# Patient Record
Sex: Male | Born: 1950 | Race: White | Hispanic: No | Marital: Married | Smoking: Former smoker
Health system: Southern US, Community
[De-identification: ages and names within clinical notes are randomized; demographics above are authoritative.]

## PROBLEM LIST (undated history)

## (undated) DIAGNOSIS — E781 Pure hyperglyceridemia: Secondary | ICD-10-CM

## (undated) DIAGNOSIS — C61 Malignant neoplasm of prostate: Secondary | ICD-10-CM

## (undated) DIAGNOSIS — I209 Angina pectoris, unspecified: Secondary | ICD-10-CM

## (undated) DIAGNOSIS — I10 Essential (primary) hypertension: Secondary | ICD-10-CM

## (undated) DIAGNOSIS — R51 Headache: Secondary | ICD-10-CM

## (undated) DIAGNOSIS — E119 Type 2 diabetes mellitus without complications: Secondary | ICD-10-CM

## (undated) DIAGNOSIS — M1990 Unspecified osteoarthritis, unspecified site: Secondary | ICD-10-CM

## (undated) HISTORY — PX: PROSTATE SURGERY: SHX751

## (undated) HISTORY — PX: COLONOSCOPY W/ POLYPECTOMY: SHX1380

## (undated) HISTORY — PX: BACK SURGERY: SHX140

---

## 1976-08-25 HISTORY — PX: APPENDECTOMY: SHX54

## 1989-04-25 HISTORY — PX: CHOLECYSTECTOMY: SHX55

## 1989-04-25 HISTORY — PX: LUMBAR DISC SURGERY: SHX700

## 2001-03-29 ENCOUNTER — Encounter: Payer: Self-pay | Admitting: Neurosurgery

## 2001-03-29 ENCOUNTER — Ambulatory Visit (HOSPITAL_COMMUNITY): Admission: RE | Admit: 2001-03-29 | Discharge: 2001-03-29 | Payer: Self-pay | Admitting: Neurosurgery

## 2006-08-07 ENCOUNTER — Ambulatory Visit: Admission: RE | Admit: 2006-08-07 | Discharge: 2006-11-05 | Payer: Self-pay | Admitting: *Deleted

## 2013-05-18 ENCOUNTER — Emergency Department (HOSPITAL_COMMUNITY): Payer: BC Managed Care – PPO

## 2013-05-18 ENCOUNTER — Encounter (HOSPITAL_COMMUNITY): Payer: Self-pay | Admitting: Family Medicine

## 2013-05-18 ENCOUNTER — Observation Stay (HOSPITAL_COMMUNITY)
Admission: EM | Admit: 2013-05-18 | Discharge: 2013-05-19 | Disposition: A | Discharge status: Home / Self Care | Payer: BC Managed Care – PPO | Attending: Cardiology | Admitting: Cardiology

## 2013-05-18 DIAGNOSIS — I208 Other forms of angina pectoris: Secondary | ICD-10-CM | POA: Diagnosis present

## 2013-05-18 DIAGNOSIS — I10 Essential (primary) hypertension: Secondary | ICD-10-CM | POA: Insufficient documentation

## 2013-05-18 DIAGNOSIS — I209 Angina pectoris, unspecified: Principal | ICD-10-CM | POA: Insufficient documentation

## 2013-05-18 DIAGNOSIS — R6884 Jaw pain: Secondary | ICD-10-CM | POA: Insufficient documentation

## 2013-05-18 DIAGNOSIS — Z79899 Other long term (current) drug therapy: Secondary | ICD-10-CM | POA: Insufficient documentation

## 2013-05-18 DIAGNOSIS — E785 Hyperlipidemia, unspecified: Secondary | ICD-10-CM | POA: Insufficient documentation

## 2013-05-18 DIAGNOSIS — Z7982 Long term (current) use of aspirin: Secondary | ICD-10-CM | POA: Insufficient documentation

## 2013-05-18 DIAGNOSIS — I200 Unstable angina: Secondary | ICD-10-CM

## 2013-05-18 DIAGNOSIS — E119 Type 2 diabetes mellitus without complications: Secondary | ICD-10-CM | POA: Insufficient documentation

## 2013-05-18 DIAGNOSIS — Z87891 Personal history of nicotine dependence: Secondary | ICD-10-CM | POA: Insufficient documentation

## 2013-05-18 DIAGNOSIS — I2089 Other forms of angina pectoris: Secondary | ICD-10-CM | POA: Diagnosis present

## 2013-05-18 DIAGNOSIS — E781 Pure hyperglyceridemia: Secondary | ICD-10-CM | POA: Insufficient documentation

## 2013-05-18 HISTORY — DX: Malignant neoplasm of prostate: C61

## 2013-05-18 HISTORY — DX: Pure hyperglyceridemia: E78.1

## 2013-05-18 HISTORY — DX: Type 2 diabetes mellitus without complications: E11.9

## 2013-05-18 HISTORY — DX: Angina pectoris, unspecified: I20.9

## 2013-05-18 HISTORY — DX: Essential (primary) hypertension: I10

## 2013-05-18 LAB — CBC
HCT: 42.4 % (ref 39.0–52.0)
MCV: 86.9 fL (ref 78.0–100.0)
Platelets: 228 10*3/uL (ref 150–400)
RBC: 4.88 MIL/uL (ref 4.22–5.81)
RDW: 13.4 % (ref 11.5–15.5)
WBC: 4.7 10*3/uL (ref 4.0–10.5)

## 2013-05-18 LAB — BASIC METABOLIC PANEL
CO2: 26 mEq/L (ref 19–32)
Calcium: 9.2 mg/dL (ref 8.4–10.5)
Chloride: 103 mEq/L (ref 96–112)
Creatinine, Ser: 0.83 mg/dL (ref 0.50–1.35)
GFR calc Af Amer: >90 mL/min (ref >90)
Potassium: 4.1 mEq/L (ref 3.5–5.1)
Sodium: 138 mEq/L (ref 135–145)

## 2013-05-18 LAB — POCT I-STAT TROPONIN I: Troponin i, poc: 0 ng/mL (ref 0.00–0.08)

## 2013-05-18 MED ORDER — ZOLPIDEM TARTRATE 5 MG PO TABS: 5.0000 mg | ORAL_TABLET | Freq: Every evening | ORAL | Status: DC | PRN

## 2013-05-18 MED ORDER — SIMVASTATIN 40 MG PO TABS
40.0000 mg | ORAL_TABLET | Freq: Every day | ORAL | Status: DC
  Administered 2013-05-19: 40 mg via ORAL
  Filled 2013-05-18: qty 1

## 2013-05-18 MED ORDER — ALPRAZOLAM 0.25 MG PO TABS: 0.2500 mg | ORAL_TABLET | Freq: Two times a day (BID) | ORAL | Status: DC | PRN

## 2013-05-18 MED ORDER — ONDANSETRON HCL 4 MG/2ML IJ SOLN: 4.0000 mg | Freq: Four times a day (QID) | INTRAMUSCULAR | Status: DC | PRN

## 2013-05-18 MED ORDER — ATENOLOL 25 MG PO TABS
25.0000 mg | ORAL_TABLET | Freq: Every day | ORAL | Status: DC
  Administered 2013-05-19: 25 mg via ORAL
  Filled 2013-05-18 (×2): qty 1

## 2013-05-18 MED ORDER — ENALAPRIL MALEATE 5 MG PO TABS
5.0000 mg | ORAL_TABLET | Freq: Every day | ORAL | Status: DC
  Administered 2013-05-19: 5 mg via ORAL
  Filled 2013-05-18: qty 1

## 2013-05-18 MED ORDER — ENOXAPARIN SODIUM 40 MG/0.4ML ~~LOC~~ SOLN
40.0000 mg | SUBCUTANEOUS | Status: DC
  Administered 2013-05-18: 40 mg via SUBCUTANEOUS
  Filled 2013-05-18 (×2): qty 0.4

## 2013-05-18 MED ORDER — OMEGA-3-ACID ETHYL ESTERS 1 G PO CAPS
1.0000 g | ORAL_CAPSULE | Freq: Two times a day (BID) | ORAL | Status: DC
Start: 2013-05-18 — End: 2013-05-19
  Administered 2013-05-18 – 2013-05-19 (×2): 1 g via ORAL
  Filled 2013-05-18 (×3): qty 1

## 2013-05-18 MED ORDER — ASPIRIN 81 MG PO CHEW
324.0000 mg | CHEWABLE_TABLET | Freq: Once | ORAL | Status: AC
  Administered 2013-05-18: 324 mg via ORAL
  Filled 2013-05-18: qty 4

## 2013-05-18 MED ORDER — ASPIRIN 81 MG PO CHEW
81.0000 mg | CHEWABLE_TABLET | Freq: Two times a day (BID) | ORAL | Status: DC
  Administered 2013-05-18 – 2013-05-19 (×2): 81 mg via ORAL
  Filled 2013-05-18 (×2): qty 1

## 2013-05-18 MED ORDER — ACETAMINOPHEN 325 MG PO TABS
650.0000 mg | ORAL_TABLET | ORAL | Status: DC | PRN
  Administered 2013-05-19: 650 mg via ORAL
  Filled 2013-05-18: qty 2

## 2013-05-18 MED ORDER — ASPIRIN 81 MG PO TABS: 81.0000 mg | ORAL_TABLET | Freq: Two times a day (BID) | ORAL | Status: DC

## 2013-05-18 NOTE — ED Notes (Signed)
Pt c/o rt jaw pain and chest pain that began after walking. Pt states the pain was sharp in nature. Pt states he had this same pain about a month ago and had a stress test done, test showed an enlarged left ventricle. Pt was prescribed nitro, pt state she did not get any immediate relief when he took 1 Nitro today. Pt also reports pain lasted for . Pt is pain free at the moment.

## 2013-05-18 NOTE — ED Provider Notes (Signed)
CSN: [id number]     Arrival date & time 05/18/13  1150 History   First MD Initiated Contact with Patient 05/18/13 1206     Chief Complaint  Patient presents with  . 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 He lives about 20 or 30 miles [address] [facility]. He was here taking a pressure from his church to a medical appointment. He was simply walking down the hallway. No unusual exertion. He felt pain in his right jaw like a bad toothache. The pain went to his neck and into his chest. He did not get pain in his extremities. He felt briefly lightheaded. He stopped and leaned against the wall. He states he became quite diaphoretic. He was placed in a wheelchair. He sat in a hallway for a few minutes. Within 10 minutes this resolved. He has not locked or exerted since. He had a similar episode with jaw pain chest pain arm pain and nausea 3 months ago. He underwent a nuclear medicine scan. His knowledge of that is that it was normal. This was done with to his primary care physician. He is hypertensive. He recently had a second medication added to his atenolol. He does not recall the name. He takes metformin for non-insulin-dependent, type 2 diabetes. His episode of pain today he would estimate of less than 10 minutes.  Past Medical History  Diagnosis Date  . Hypertension   . Diabetes mellitus without complication    Past Surgical History  Procedure Laterality Date  . Back surgery    . Cholecystectomy    . Appendectomy     History reviewed. No pertinent family history. History  Substance Use Topics  . Smoking status: Never Smoker   . Smokeless tobacco: Not on file  . Alcohol Use: No    Review of Systems  Constitutional: Negative for fever, chills, diaphoresis, appetite change and fatigue.  HENT: Negative for sore throat, mouth sores and trouble swallowing.        Right jaw pain.  Neck pain  Eyes: Negative for visual disturbance.  Respiratory: Positive for chest tightness. Negative for  cough, shortness of breath and wheezing.   Cardiovascular: Positive for chest pain. Negative for palpitations and leg swelling.  Gastrointestinal: Negative for nausea, vomiting, abdominal pain, diarrhea and abdominal distention.  Endocrine: Negative for polydipsia, polyphagia and polyuria.  Genitourinary: Negative for dysuria, frequency and hematuria.  Musculoskeletal: Negative for gait problem.  Skin: Negative for color change, pallor and rash.  Neurological: Positive for dizziness. Negative for syncope, light-headedness and headaches.  Hematological: Does not bruise/bleed easily.  Psychiatric/Behavioral: Negative for behavioral problems and confusion.    Allergies  Neosporin  Home Medications   Current Outpatient Rx  Name  Route  Sig  Dispense  Refill  . aspirin 81 MG tablet   Oral   Take 81 mg by mouth 2 (two) times daily.         Marland Kitchen atenolol (TENORMIN) 25 MG tablet   Oral   Take 25 mg by mouth daily.         Marland Kitchen CINNAMON PO   Oral   Take 2 tablets by mouth 2 (two) times daily.         . enalapril (VASOTEC) 5 MG tablet   Oral   Take 5 mg by mouth daily.         . metFORMIN (GLUCOPHAGE) 500 MG tablet   Oral   Take 500 mg by mouth 2 (two) times daily with  a meal.         . Omega-3 Fatty Acids (FISH OIL PO)   Oral   Take 1 tablet by mouth 2 (two) times daily.          BP 117/68  Pulse 55  Temp(Src) 97.9 F (36.6 C) (Oral)  Resp 14  Ht 6\' 3"  (1.905 m)  Wt 318 lb (144.244 kg)  BMI 39.75 kg/m2  SpO2 98% Physical Exam  Constitutional: He is oriented to person, place, and time. He appears well-developed and well-nourished. No distress.  Very pleasant obese 62 year old male. Currently not diaphoretic or pale. Conversant.  Calm  HENT:  Head: Normocephalic.  Eyes: Conjunctivae are normal. Pupils are equal, round, and reactive to light. No scleral icterus.  Neck: Normal range of motion. Neck supple. No thyromegaly present.  No bruits  Cardiovascular:  Normal rate, regular rhythm, S1 normal and S2 normal.  Exam reveals no gallop and no friction rub.   No murmur heard. Normal heart tones  Pulmonary/Chest: Effort normal and breath sounds normal. No respiratory distress. He has no wheezes. He has no rales.  Clear lungs  Abdominal: Soft. Bowel sounds are normal. He exhibits no distension. There is no tenderness. There is no rebound.  Musculoskeletal: Normal range of motion.  Neurological: He is alert and oriented to person, place, and time.  Skin: Skin is warm and dry. No rash noted.  No edema  Psychiatric: He has a normal mood and affect. His behavior is normal.    ED Course  Procedures (including critical care time)  EKG: Indication chest pain. Interpretation: Sinus bradycardia rate of 56. Slow R wave progression. No comparison. No Q waves, no ST segment changes or inverted T waves  Labs Review Labs Reviewed  BASIC METABOLIC PANEL - Abnormal; Notable for the following:    Glucose, Bld 113 (*)    All other components within normal limits  CBC  POCT I-STAT TROPONIN I   Imaging Review Dg Chest 2 View  05/18/2013   CLINICAL DATA:  Chest pain  EXAM: CHEST  2 VIEW  COMPARISON:  None.  FINDINGS: The heart size and mediastinal contours are within normal limits. No acute infiltrate or pulmonary edema. Mild degenerative changes mid and lower thoracic spine.  IMPRESSION: No active disease. Mild degenerative changes thoracic spine.   Electronically Signed   By: Natasha Mead   On: 05/18/2013 13:34    MDM   1. Unstable angina    Pain is concerning in this in its description for surgical angina. He's had no symptoms at rest. A brief, less than 10 minutes. EKG does not show injury or infarct.   Initial troponin is normal. He continues to be symptom-free. I discussed the case with cardiology on-call. Their plan is to see the patient emergency room. Second troponin, to a level, pending at this time. Diagnosis is chest pain probable new, unstable  angina without signs of acute infarct, ACS. He was given aspirin emergency room    Roney Marion, MD 05/18/13 1419

## 2013-05-18 NOTE — ED Notes (Signed)
Per pt sts jaw pain  And chest pain that started while walking today. sts recent stress test. sts pain is currently gone. sts some diaphoresis. Denies nausea and SOB.

## 2013-05-18 NOTE — H&P (Signed)
History and Physical   Patient ID: William Guerra MRN: [id number], DOB/AGE: August 15, 1951 62 y.o. Date of Encounter: 05/18/2013  Primary Physician: Dr. Lois Huxley, [facility] Primary Cardiologist: New  Chief Complaint:  Chest pain  HPI: William Guerra is a 62 y.o. male with no history of CAD.   He was at [facility] with a parishioner today. He was walking down the hall (not strenuous) and had onset of right jaw pain (like a toothache), then had onset of sharp pain across his chest. 8/10. He became pale and diaphoretic. He felt light-headed. He leaned against the wall and took SL NTG x 1. The NTG may have helped. He came to the ER and symptoms resolved. He was sent to the ER. His CBG was minimally elevated, other labs are OK. The total duration of the pain was about 15 minutes.  He had a similar episode recently that was associated with left arm pain and nausea. It started at rest and lasted about 10 minutes. He had a nuclear stress test for these symptoms that was OK. He has exercised in the past and never had symptoms but his activity level in the last few weeks has been low. 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 Currently, he is resting comfortably.   Past Medical History  Diagnosis Date  . Hypertension   . Diabetes mellitus without complication   . Hypertriglyceridemia   . Prostate cancer     Surgical History:  Past Surgical History  Procedure Laterality Date  . Back surgery    . Cholecystectomy    . Appendectomy    . Prostate surgery      brachytherapy to the prostate     I have reviewed the patient's current medications. Prior to Admission medications   Medication Sig Start Date End Date Taking? Authorizing Provider  aspirin 81 MG tablet Take 81 mg by mouth 2 (two) times daily.   Yes Historical Provider, MD  atenolol (TENORMIN) 25 MG tablet Take 25 mg by mouth daily.   Yes Historical Provider, MD  CINNAMON PO Take 2  tablets by mouth 2 (two) times daily.   Yes Historical Provider, MD  enalapril (VASOTEC) 5 MG tablet Take 5 mg by mouth daily.   Yes Historical Provider, MD  metFORMIN (GLUCOPHAGE) 500 MG tablet Take 500 mg by mouth 2 (two) times daily with a meal.   Yes Historical Provider, MD  Omega-3 Fatty Acids (FISH OIL PO) Take 1 tablet by mouth 2 (two) times daily.   Yes Historical Provider, MD  simvastatin (ZOCOR) 40 MG tablet Take 40 mg by mouth daily.   Yes Historical Provider, MD   Allergies:  Allergies  Allergen Reactions  . Neosporin [Neomycin-Bacitracin Zn-Polymyx] Rash    History   Social History  . Marital Status: Married    Spouse Name: N/A    Number of Children: N/A  . Years of Education: N/A   Occupational History  . Pastor    Social History Main Topics  . Smoking status: Former Smoker -- 20 years    Quit date: 08/25/1990  . Smokeless tobacco: Never Used  . Alcohol Use: No  . Drug Use: No  . Sexual Activity: Not on file   Other Topics Concern  . Not on file   Social History Narrative   Pt lives with wife. Tries to exercise.     Family Status  Relation Status Death Age  . Mother Deceased 63  No Hx CAD  . Father Deceased 6    Died of CHF    Review of Systems:   Full 14-point review of systems otherwise negative except as noted above.  Physical Exam: Blood pressure 117/68, pulse 55, temperature 97.9 F (36.6 C), temperature source Oral, resp. rate 14, height 6\' 3"  (1.905 m), weight 318 lb (144.244 kg), SpO2 98.00%. General: Well developed, well nourished,male in no acute distress. Head: Normocephalic, atraumatic, sclera non-icteric, no xanthomas, nares are without discharge. Dentition: good Neck: No carotid bruits. JVD not elevated. No thyromegally Lungs: Good expansion bilaterally. without wheezes or rhonchi.  Heart: Regular rate and rhythm with S1 S2.  No S3 or S4.  No murmur, no rubs, or gallops appreciated. Abdomen: Obese, Soft, non-tender, non-distended  with normoactive bowel sounds. No hepatomegaly. No rebound/guarding. No obvious abdominal masses. Msk:  Strength and tone appear normal for age. No joint deformities or effusions, no spine or costo-vertebral angle tenderness. Extremities: No clubbing or cyanosis. No edema.  Distal pedal pulses are 2+ in 4 extrem Neuro: Alert and oriented X 3. Moves all extremities spontaneously. No focal deficits noted. Psych:  Responds to questions appropriately with a normal affect. Skin: No rashes or lesions noted  Labs:   Lab Results  Component Value Date   WBC 4.7 05/18/2013   HGB 14.3 05/18/2013   HCT 42.4 05/18/2013   MCV 86.9 05/18/2013   PLT 228 05/18/2013      Recent Labs Lab 05/18/13 1237  NA 138  K 4.1  CL 103  CO2 26  BUN 17  CREATININE 0.83  CALCIUM 9.2  GLUCOSE 113*    Recent Labs  05/18/13 1247  TROPIPOC 0.00   Radiology/Studies: Dg Chest 2 View 05/18/2013   CLINICAL DATA:  Chest pain  EXAM: CHEST  2 VIEW  COMPARISON:  None.  FINDINGS: The heart size and mediastinal contours are within normal limits. No acute infiltrate or pulmonary edema. Mild degenerative changes mid and lower thoracic spine.  IMPRESSION: No active disease. Mild degenerative changes thoracic spine.   Electronically Signed   By: Natasha Mead   On: 05/18/2013 13:34   ECG: SR, no ST elevation.  ASSESSMENT AND PLAN:  Principal Problem:   Angina of effort - has had 2 episodes, 1 with, 1 without exertion. Recent nuclear stress test was normal, per pt. Symptoms not completely typical but angina is a significant concern. Could admit overnight, cycle enzymes and do cardiac CT if enzymes negative to eval for obstructive CAD.   Melida Quitter, PA-C 05/18/2013 3:29 PM Beeper [phone]  Patient has had 2 episodes of severe jaw pain with radiation down to the chest.  The first happened about a month ago while working at the computer.  He had a stress nuclear after this at [facility] that was reportedly  normal.  Today, he was walking in the hall at the hospital when he had a similar episode.  The pain lasted 15 minutes.  NTG did not seem to make much difference.  He is now pain-free.  ECG is non-acute.  Initial cardiac enzymes negative.  He occasionally walks up to 5 miles for exercise.  He does not get exertional chest pain or dyspnea with these walks.    Cardiac RFs include HTN, type II diabetes, and hyperlipidemia.   Will place in observation and cycle cardiac enzymes.  If enzymes remain negative, plan coronary CT angiogram in the morning tomorrow.  HR is adequate on atenolol for coronary CTA.  Marca Ancona 05/18/2013 3:37 PM

## 2013-05-18 NOTE — Progress Notes (Signed)
UR Completed Marti Acebo Graves-Bigelow, RN,BSN [phone]  

## 2013-05-19 ENCOUNTER — Observation Stay (HOSPITAL_COMMUNITY): Payer: BC Managed Care – PPO

## 2013-05-19 ENCOUNTER — Other Ambulatory Visit: Payer: Self-pay

## 2013-05-19 DIAGNOSIS — R079 Chest pain, unspecified: Secondary | ICD-10-CM

## 2013-05-19 LAB — TROPONIN I: Troponin I: <0.3 ng/mL (ref <0.30)

## 2013-05-19 MED ORDER — INSULIN ASPART 100 UNIT/ML ~~LOC~~ SOLN
0.0000 [IU] | Freq: Three times a day (TID) | SUBCUTANEOUS | Status: DC
  Administered 2013-05-19: 2 [IU] via SUBCUTANEOUS

## 2013-05-19 MED ORDER — IOHEXOL 350 MG/ML SOLN
100.0000 mL | Freq: Once | INTRAVENOUS | Status: AC | PRN
  Administered 2013-05-19: 100 mL via INTRAVENOUS

## 2013-05-19 MED ORDER — NITROGLYCERIN 0.4 MG SL SUBL
0.4000 mg | SUBLINGUAL_TABLET | SUBLINGUAL | Status: DC | PRN
  Administered 2013-05-19: 0.4 mg via SUBLINGUAL

## 2013-05-19 NOTE — Discharge Summary (Signed)
CARDIOLOGY DISCHARGE SUMMARY   Patient ID: BRYER COZZOLINO MRN: [id number] DOB/AGE: 1950-12-23 62 y.o.  Admit date: 05/18/2013 Discharge date: 05/19/2013  Primary Discharge Diagnosis:   Secondary Discharge Diagnosis:  Principal Problem:   Angina of effort  Procedures: Cardiac CTA  Hospital Course: [place] FRIEDEN is a 62 y.o. male with no history of CAD. He had an episode of chest pain while visiting someone in the hospital. He was admitted for further evaluation and treatment.   His cardiac enzymes were negative for MI. He had a recent nuclear study so a cardiac CTA was performed. The results are below. There was no significant CAD on the images. Dr. Shirlee Latch reassessed Mr. Schifano and reviewed all data. His symptoms had resolved and he was doing well. Dr. Shirlee Latch considered him stable for discharge, to follow up as an outpatient.   Labs:  Lab Results  Component Value Date   WBC 4.7 05/18/2013   HGB 14.3 05/18/2013   HCT 42.4 05/18/2013   MCV 86.9 05/18/2013   PLT 228 05/18/2013     Recent Labs Lab 05/18/13 1237  NA 138  K 4.1  CL 103  CO2 26  BUN 17  CREATININE 0.83  CALCIUM 9.2  GLUCOSE 113*    Recent Labs  05/19/13 0805  TROPONINI <0.30   Radiology: Dg Chest 2 View 05/18/2013   CLINICAL DATA:  Chest pain  EXAM: CHEST  2 VIEW  COMPARISON:  None.  FINDINGS: The heart size and mediastinal contours are within normal limits. No acute infiltrate or pulmonary edema. Mild degenerative changes mid and lower thoracic spine.  IMPRESSION: No active disease. Mild degenerative changes thoracic spine.   Electronically Signed   By: Natasha Mead   On: 05/18/2013 13:34   Ct Coronary Morp W/cta Cor W/score W/ca W/cm &/or Wo/cm 05/19/2013   *RADIOLOGY REPORT*  CARDIAC CTA WITH CALCIUM SCORE 05/19/2013 10:57:00  Ordering Physician: DALTONMCLEAN  Reading Physician: DaltonS.Mclean  Protocol:  The patient scanned on a Philips 256 slice scanner. Prospective gating with 5% phase tolerance at 120 kV  was used. Average heart rate during the scan was 55 beats per minute.  After an initial AP and lateral topogram, 3 mm axial slices were performed through the heart for calcium scoring.  The patient then had a 80 ml bolus of contrast given for coronary CTA with bolus tracking in the descending thoracic aorta.  The 3-D data set was then sent to the [employer].  Reconstructions were done using MIP,MPR and VRT modes.  Indications: Chest pain  DETAILED FINDINGS:  Quality of Study: Fair (limited somewhat by body habitus)  Left Main: No plaque or stenosis  Left Anterior Descending: Moderate-sized first diagonal.  No significant stenosis in the LAD system.  Cannot rule out nonobstructive soft plaque in the proximal LAD but may be artifact.  Left Circumflex: No plaque or stenosis noted.  The distal LCx was not visualized well.  Right Coronary Artery: Dominant vessel.  No plaque or stenosis noted. The PDA and PLV were not well-visualized beyond the proximal vessels.  Coronary Calcium Score: 0 Agatston units  Other: Non-cardiac findings to be read by [facility].  IMPRESSION: 1. No significant stenosis noted in the coronaries (cannot rule out nonobstructive soft plaque in proximal LAD but may be artifact).  2. Coronary artery calcium score = 0 Agatston units, suggesting low risk of future cardiovascular events.  3. Noncardiac findings to be reported by [facility].   Original Report Authenticated By: Freida Busman  Mclean    EKG:  05/19/2013 Sinus Bradycardia Vent. rate 59 BPM PR interval 190 ms QRS duration 104 ms QT/QTc 464/459 ms P-R-T axes 58 -14 33  FOLLOW UP PLANS AND APPOINTMENTS Allergies  Allergen Reactions  . Neosporin [Neomycin-Bacitracin Zn-Polymyx] Rash     Medication List         aspirin 81 MG tablet  Take 81 mg by mouth 2 (two) times daily.     atenolol 25 MG tablet  Commonly known as:  TENORMIN  Take 25 mg by mouth daily.     CINNAMON PO  Take 2 tablets by mouth 2  (two) times daily.     enalapril 5 MG tablet  Commonly known as:  VASOTEC  Take 5 mg by mouth daily.     FISH OIL PO  Take 1 tablet by mouth 2 (two) times daily.     metFORMIN 500 MG tablet  Commonly known as:  GLUCOPHAGE  Take 500 mg by mouth 2 (two) times daily with a meal.     simvastatin 40 MG tablet  Commonly known as:  ZOCOR  Take 40 mg by mouth daily.         Follow-up Information   Follow up with Marca Ancona, MD. (As needed)    Specialty:  Cardiology   Contact information:   [address] [place] [address] [phone]       BRING ALL MEDICATIONS WITH YOU TO FOLLOW UP APPOINTMENTS  Time spent with patient to include physician time: 35 min Signed: Theodore Demark, PA-C 05/19/2013, 2:43 PM Co-Sign MD

## 2013-05-19 NOTE — Progress Notes (Signed)
Patient ID: William Guerra, male   DOB: January 24, 1951, 62 y.o.   MRN: [id number]    SUBJECTIVE: No further chest pain, no complaints.    Marland Kitchen aspirin  81 mg Oral BID  . atenolol  25 mg Oral Daily  . enalapril  5 mg Oral Daily  . enoxaparin (LOVENOX) injection  40 mg Subcutaneous Q24H  . omega-3 acid ethyl esters  1 g Oral BID  . simvastatin  40 mg Oral Daily      Filed Vitals:   05/18/13 1600 05/18/13 1636 05/18/13 2213 05/19/13 0637  BP: 123/66 134/86 135/76 120/67  Pulse: 56 58 66 52  Temp:  98 F (36.7 C) 97.7 F (36.5 C) 97.8 F (36.6 C)  TempSrc:  Oral Oral Oral  Resp: 12 16 18 18   Height:      Weight:      SpO2: 99% 97% 100% 100%   No intake or output data in the 24 hours ending 05/19/13 0740  LABS: Basic Metabolic Panel:  Recent Labs  19/14/78 1237  NA 138  K 4.1  CL 103  CO2 26  GLUCOSE 113*  BUN 17  CREATININE 0.83  CALCIUM 9.2   Liver Function Tests: No results found for this basename: AST, ALT, ALKPHOS, BILITOT, PROT, ALBUMIN,  in the last 72 hours No results found for this basename: LIPASE, AMYLASE,  in the last 72 hours CBC:  Recent Labs  05/18/13 1237  WBC 4.7  HGB 14.3  HCT 42.4  MCV 86.9  PLT 228   Cardiac Enzymes: No results found for this basename: CKTOTAL, CKMB, CKMBINDEX, TROPONINI,  in the last 72 hours BNP: No components found with this basename: POCBNP,  D-Dimer: No results found for this basename: DDIMER,  in the last 72 hours Hemoglobin A1C: No results found for this basename: HGBA1C,  in the last 72 hours Fasting Lipid Panel: No results found for this basename: CHOL, HDL, LDLCALC, TRIG, CHOLHDL, LDLDIRECT,  in the last 72 hours Thyroid Function Tests: No results found for this basename: TSH, T4TOTAL, FREET3, T3FREE, THYROIDAB,  in the last 72 hours Anemia Panel: No results found for this basename: VITAMINB12, FOLATE, FERRITIN, TIBC, IRON, RETICCTPCT,  in the last 72 hours  RADIOLOGY: Dg Chest 2 View  05/18/2013    CLINICAL DATA:  Chest pain  EXAM: CHEST  2 VIEW  COMPARISON:  None.  FINDINGS: The heart size and mediastinal contours are within normal limits. No acute infiltrate or pulmonary edema. Mild degenerative changes mid and lower thoracic spine.  IMPRESSION: No active disease. Mild degenerative changes thoracic spine.   Electronically Signed   By: Natasha Mead   On: 05/18/2013 13:34    PHYSICAL EXAM General: NAD Neck: No JVD, no thyromegaly or thyroid nodule.  Lungs: Clear to auscultation bilaterally with normal respiratory effort. CV: Nondisplaced PMI.  Heart regular S1/S2, no S3/S4, no murmur.  No peripheral edema.  No carotid bruit.  Normal pedal pulses.  Abdomen: Soft, nontender, no hepatosplenomegaly, no distention.  Neurologic: Alert and oriented x 3.  Psych: Normal affect. Extremities: No clubbing or cyanosis.   TELEMETRY: Reviewed telemetry pt in NSR  ASSESSMENT AND PLAN: 62 yo with history of DM, HTN presented was admitted after episode of jaw pain/chest pain.  Patient had a similar episode 1 month ago and had a normal nuclear stress test.  No further CP overnight.  Plan for coronary CT angiogram this morning if am troponin is negative (still needs to be drawn).  Marca Ancona 05/19/2013 7:42 AM

## 2013-05-19 NOTE — Progress Notes (Signed)
DC orders received.  Patient stable with no S/S of distress.  Medication and discharge information reviewed with patient.  Patient DC home. William Guerra  

## 2013-05-20 LAB — GLUCOSE, CAPILLARY: Glucose-Capillary: 130 mg/dL — ABNORMAL HIGH (ref 70–99)

## 2014-02-05 ENCOUNTER — Encounter (HOSPITAL_COMMUNITY): Payer: Self-pay | Admitting: Emergency Medicine

## 2014-02-05 ENCOUNTER — Emergency Department (HOSPITAL_COMMUNITY): Payer: BC Managed Care – PPO

## 2014-02-05 ENCOUNTER — Emergency Department (HOSPITAL_COMMUNITY)
Admission: EM | Admit: 2014-02-05 | Discharge: 2014-02-05 | Disposition: A | Discharge status: Home / Self Care | Payer: BC Managed Care – PPO | Attending: Emergency Medicine | Admitting: Emergency Medicine

## 2014-02-05 DIAGNOSIS — E785 Hyperlipidemia, unspecified: Secondary | ICD-10-CM | POA: Insufficient documentation

## 2014-02-05 DIAGNOSIS — W108XXA Fall (on) (from) other stairs and steps, initial encounter: Secondary | ICD-10-CM | POA: Insufficient documentation

## 2014-02-05 DIAGNOSIS — IMO0002 Reserved for concepts with insufficient information to code with codable children: Secondary | ICD-10-CM | POA: Insufficient documentation

## 2014-02-05 DIAGNOSIS — Z79899 Other long term (current) drug therapy: Secondary | ICD-10-CM | POA: Insufficient documentation

## 2014-02-05 DIAGNOSIS — Z87891 Personal history of nicotine dependence: Secondary | ICD-10-CM | POA: Insufficient documentation

## 2014-02-05 DIAGNOSIS — Z8546 Personal history of malignant neoplasm of prostate: Secondary | ICD-10-CM | POA: Insufficient documentation

## 2014-02-05 DIAGNOSIS — Y9389 Activity, other specified: Secondary | ICD-10-CM | POA: Insufficient documentation

## 2014-02-05 DIAGNOSIS — E119 Type 2 diabetes mellitus without complications: Secondary | ICD-10-CM | POA: Insufficient documentation

## 2014-02-05 DIAGNOSIS — I10 Essential (primary) hypertension: Secondary | ICD-10-CM | POA: Insufficient documentation

## 2014-02-05 DIAGNOSIS — S8990XA Unspecified injury of unspecified lower leg, initial encounter: Secondary | ICD-10-CM

## 2014-02-05 DIAGNOSIS — S99929A Unspecified injury of unspecified foot, initial encounter: Principal | ICD-10-CM

## 2014-02-05 DIAGNOSIS — Y929 Unspecified place or not applicable: Secondary | ICD-10-CM | POA: Insufficient documentation

## 2014-02-05 DIAGNOSIS — Z7982 Long term (current) use of aspirin: Secondary | ICD-10-CM | POA: Insufficient documentation

## 2014-02-05 DIAGNOSIS — S99919A Unspecified injury of unspecified ankle, initial encounter: Principal | ICD-10-CM

## 2014-02-05 MED ORDER — MORPHINE SULFATE 4 MG/ML IJ SOLN
6.0000 mg | Freq: Once | INTRAMUSCULAR | Status: AC
  Administered 2014-02-05: 6 mg via INTRAVENOUS
  Filled 2014-02-05: qty 2

## 2014-02-05 MED ORDER — SIMVASTATIN 40 MG PO TABS: 40.0000 mg | ORAL_TABLET | Freq: Every day | ORAL | Status: DC

## 2014-02-05 MED ORDER — ATENOLOL 25 MG PO TABS
25.0000 mg | ORAL_TABLET | Freq: Every day | ORAL | Status: DC
  Filled 2014-02-05: qty 1

## 2014-02-05 MED ORDER — OXYCODONE-ACETAMINOPHEN 5-325 MG PO TABS: 1.0000 | ORAL_TABLET | ORAL | Status: DC | PRN

## 2014-02-05 MED ORDER — ASPIRIN 81 MG PO TABS: 81.0000 mg | ORAL_TABLET | Freq: Two times a day (BID) | ORAL | Status: DC

## 2014-02-05 MED ORDER — OXYCODONE-ACETAMINOPHEN 5-325 MG PO TABS: 1.0000 | ORAL_TABLET | ORAL | Status: AC | PRN

## 2014-02-05 MED ORDER — METFORMIN HCL 500 MG PO TABS: 500.0000 mg | ORAL_TABLET | Freq: Two times a day (BID) | ORAL | Status: DC

## 2014-02-05 NOTE — ED Notes (Signed)
Care Coordinator at bedside

## 2014-02-05 NOTE — Progress Notes (Signed)
Orthopedic Tech Progress Note Patient Details:  William Guerra October 13, 1950 [id number]  Ortho Devices Type of Ortho Device: Knee Immobilizer Ortho Device/Splint Interventions: Application   Cammer, Theodoro Parma 02/05/2014, 3:40 PM

## 2014-02-05 NOTE — ED Notes (Signed)
Pt in from home via [facility], per report pt fell down steps about 9 ft, denies head injury & LOC, pt c/o bil lower leg pain worse in the L knee, swelling present upon arrival to ED, pt c/o L arm pain, pt has skin tear to left elbow, pt A&O x4, follows commands, speaks in complete sentences

## 2014-02-05 NOTE — ED Notes (Signed)
Pt is in a gown and on the monitor. 

## 2014-02-05 NOTE — ED Provider Notes (Addendum)
CSN: [id number]     Arrival date & time 02/05/14  1032 History   First MD Initiated Contact with Patient 02/05/14 1055     Chief Complaint  Patient presents with  . Fall     (Consider location/radiation/quality/duration/timing/severity/associated sxs/prior Treatment) HPI Patient reports he slipped and fell in his home 9:15 AM today injuring both knees. Brought by EMS. No treatment prior to coming here. Pain is worse with movement improved with remaining still. Presently declines pain medicine pain is nonradiating. Patient also suffered an abrasion to his left elbow as a result of the event. No other complaint. Past Medical History  Diagnosis Date  . Hypertension   . Hypertriglyceridemia   . Anginal pain     "couple times recently" (05/18/2013)  . Type II diabetes mellitus   . Prostate cancer ~ 2005   Past Surgical History  Procedure Laterality Date  . Back surgery    . Prostate surgery  ~ 2005    brachytherapy to the prostate  . Cholecystectomy  1990's  . Appendectomy  1978  . Lumbar disc surgery  1990's    "L5" (05/18/2013)   No family history on file. History  Substance Use Topics  . Smoking status: Former Smoker -- 2.00 packs/day for 16 years    Types: Cigarettes    Quit date: 08/25/1990  . Smokeless tobacco: Never Used  . Alcohol Use: No    Review of Systems  Constitutional: Negative.   HENT: Negative.   Respiratory: Negative.   Cardiovascular: Negative.   Gastrointestinal: Negative.   Musculoskeletal: Positive for arthralgias.       Bilateral knee pain  Skin: Positive for wound.       Abrasion left elbow  Neurological: Negative.   Psychiatric/Behavioral: Negative.   All other systems reviewed and are negative.     Allergies  Neosporin  Home Medications   Prior to Admission medications   Medication Sig Start Date End Date Taking? Authorizing Provider  aspirin 81 MG tablet Take 81 mg by mouth 2 (two) times daily.    Historical Provider, MD  atenolol  (TENORMIN) 25 MG tablet Take 25 mg by mouth daily.    Historical Provider, MD  CINNAMON PO Take 2 tablets by mouth 2 (two) times daily.    Historical Provider, MD  enalapril (VASOTEC) 5 MG tablet Take 5 mg by mouth daily.    Historical Provider, MD  metFORMIN (GLUCOPHAGE) 500 MG tablet Take 500 mg by mouth 2 (two) times daily with a meal.    Historical Provider, MD  Omega-3 Fatty Acids (FISH OIL PO) Take 1 tablet by mouth 2 (two) times daily.    Historical Provider, MD  simvastatin (ZOCOR) 40 MG tablet Take 40 mg by mouth daily.    Historical Provider, MD   BP 118/63  Pulse 75  Temp(Src) 98.2 F (36.8 C) (Oral)  Resp 24  Ht $R'6\' 3"'Si$  (1.905 m)  Wt 319 lb (144.697 kg)  BMI 39.87 kg/m2  SpO2 98% Physical Exam  Nursing note and vitals reviewed. Constitutional: He is oriented to person, place, and time. He appears well-developed and well-nourished.  HENT:  Head: Normocephalic and atraumatic.  Eyes: Conjunctivae are normal. Pupils are equal, round, and reactive to light.  Neck: Neck supple. No tracheal deviation present. No thyromegaly present.  Cardiovascular: Normal rate and regular rhythm.   No murmur heard. Pulmonary/Chest: Effort normal and breath sounds normal.  Abdominal: Soft. Bowel sounds are normal. He exhibits no distension. There is no tenderness.  Musculoskeletal: Normal range of motion. He exhibits no edema and no tenderness.  Left upper extremity with 2 cm abrasion at lateral aspect of elbow. Full range of motion no tenderness. Left lower extremity with no point tenderness. He has pain at knee on active flexion. Note Lachman sign no posterior drawer sign no collateral weakness. There is some soft tissue swelling at the knee. Right lower extremity he is tender at suprapatellar region. No Lachman sign no posterior drawer sign no ligamentous laxity. DP pulses 2+ bilaterally. Right upper extremity without contusion abrasion or tenderness neurovascularly intact. Entire spine nontender  pelvis stable nontender  Neurological: He is alert and oriented to person, place, and time. Coordination normal.  Skin: Skin is warm and dry. No rash noted.  Psychiatric: He has a normal mood and affect.    ED Course  Procedures (including critical care time) Labs Review Labs Reviewed - No data to display  Imaging Review No results found.   EKG Interpretation None     Patient declined pain medicine. Upon attempting to get out bed. He complained of pain at the bilateral knees. Morphine 6 g IV ordered. Pain is improved after treatment with intravenous morphine. Bilateral knee immobilizers were placed and patient was given crutches however he was unable to ambulate with knee immobilizer crutches. Social worker and case management was called to arrange for wheelchair and other home health needs. A large wheelchair fit this patient is not available to tomorrow X-rays viewed by me MDM  I suspect ligamentous injuries to bilateral knees. Patient will stay in the emergency department overnight until his home health needs are met tomorrow. I spoke with Dr. Alain Marion, orthopedics who will dilate patient in the emergency department. Dr. Percell Miller states unless change in his status he can followup as an outpatient and further outpatient imaging can be arranged from his office Diagnosis #1 fall #2 bilateral knee strain #3 abrasion to left elbow Final diagnoses:  None        Orlie Dakin, MD 02/05/14 1635  Addendum patient's friend has a medical supply store and brought a wide wheelchair which patient can use in take home with him. He will get a bedside commode delivered to him tomorrow. He wishes to go home at present. Plan prescription Percocet followup with Dr. Percell Miller as outpatient Note single blood pressure of 77/52 noted to be spurious Orlie Dakin, MD 02/05/14 Love, MD 02/05/14 6433

## 2014-02-05 NOTE — Progress Notes (Signed)
Orthopedic Tech Progress Note Patient Details:  William Guerra 1950/09/16 [id number]  Ortho Devices Type of Ortho Device: Knee Immobilizer;Crutches Ortho Device/Splint Interventions: Application   Cammer, Theodoro Parma 02/05/2014, 2:39 PM

## 2014-02-05 NOTE — ED Notes (Addendum)
BP cuff adjusted; error in value of 77/52; 105/51 upon retake of BP

## 2014-02-05 NOTE — Discharge Instructions (Signed)
Knee Effusion Call Dr. Percell Miller tomorrow to arrange to be seen in the office at the next available appointment.  Knee effusion means you have fluid in your knee. The knee may be more difficult to bend and move. HOME CARE  Use crutches or a brace as told by your doctor.  Put ice on the injured area.  Put ice in a plastic bag.  Place a towel between your skin and the bag.  Leave the ice on for 15-20 minutes, 03-04 times a day.  Raise (elevate) your knee as much as possible.  Only take medicine as told by your doctor.  You may need to do strengthening exercises. Ask your doctor.  Continue with your normal diet and activities as told by your doctor. GET HELP RIGHT AWAY IF:  You have more puffiness (swelling) in your knee.  You see redness, puffiness, or have more pain in your knee.  You have a temperature by mouth above 102 F (38.9 C).  You get a rash.  You have trouble breathing.  You have a reaction to any medicine you are taking.  You have a lot of pain when you move your knee. MAKE SURE YOU:  Understand these instructions.  Will watch your condition.  Will get help right away if you are not doing well or get worse. Document Released: 09/13/2010 Document Revised: 11/03/2011 Document Reviewed: 09/13/2010 [facility] Patient Information 2014 [place], [place].

## 2014-02-05 NOTE — Progress Notes (Signed)
ED Case Manager consulted by Dr. Cathleen Fears concerning home health needs. patient presented to ED via EMS with bilateral knee injuries. Patient fell down a flight of stairs at home while preparing for church. Patient rating pain 10/10 on arrival. Patients pain was addressed with pain meds after initial assessment. Knee and ankle x-rays done no fractures identified. Attempted  ambulation evolution which was unsuccessful. Bilateral knee immobilizer are  inplace. Met with patient and wife at bedside, discussed Recommendation w/c for home use and 3:1chair, and f/u with Ortho. EDP recommends equipment for safe discharge. Patient and family agreeable to discharge plan. Discussed DME agencies choice was given. [facility] selected by patient. Referral called in to [facility] from [facility].  Unable to get hair attic w/c or 3:1 chair delivery to hospital today. He states that it will be shipped to his for tomorrow. Discussed with Dr Milagros Evener. Discussed this with patient. Patient states, he has a friend with a medical supply company that he may be able to get wheelchair from today. Patient states he will have the support at home, doesn't want to stay in hospital. Pt reports that his friend can get the w/c, for discharge.  Discussed  Equipment will be  delivered to his home, someone will contact him within 24 hour to set up delivery time. Pt verbalized understanding teach back done.  No further CM ED needs identified.

## 2014-02-09 ENCOUNTER — Encounter (HOSPITAL_COMMUNITY): Payer: Self-pay | Admitting: Pharmacy Technician

## 2014-02-09 ENCOUNTER — Encounter (HOSPITAL_COMMUNITY): Payer: Self-pay | Admitting: *Deleted

## 2014-02-09 NOTE — Progress Notes (Signed)
02/09/14 1841  OBSTRUCTIVE SLEEP APNEA  Have you ever been diagnosed with sleep apnea through a sleep study? No  Do you snore loudly (loud enough to be heard through closed doors)?  1  Has anyone observed you stop breathing during your sleep? 1  Do you have, or are you being treated for high blood pressure? 1  BMI more than 35 kg/m2? 1  Age over 62 years old? 1  Neck circumference greater than 40 cm/16 inches? 1 (19)  Gender: 1 (19)  Obstructive Sleep Apnea Score 7  Score 4 or greater  Results sent to PCP

## 2014-02-10 ENCOUNTER — Encounter (HOSPITAL_COMMUNITY): Payer: BC Managed Care – PPO | Admitting: Certified Registered Nurse Anesthetist

## 2014-02-10 ENCOUNTER — Observation Stay (HOSPITAL_COMMUNITY)
Admission: RE | Admit: 2014-02-10 | Discharge: 2014-02-15 | Disposition: A | Discharge status: Home Health | Payer: BC Managed Care – PPO | Source: Ambulatory Visit | Attending: Orthopedic Surgery | Admitting: Orthopedic Surgery

## 2014-02-10 ENCOUNTER — Ambulatory Visit (HOSPITAL_COMMUNITY): Payer: BC Managed Care – PPO | Admitting: Certified Registered Nurse Anesthetist

## 2014-02-10 ENCOUNTER — Encounter (HOSPITAL_COMMUNITY)
Admission: RE | Disposition: A | Discharge status: Home Health | Payer: Self-pay | Source: Ambulatory Visit | Attending: Orthopedic Surgery

## 2014-02-10 ENCOUNTER — Encounter (HOSPITAL_COMMUNITY): Payer: Self-pay | Admitting: *Deleted

## 2014-02-10 DIAGNOSIS — S76111A Strain of right quadriceps muscle, fascia and tendon, initial encounter: Secondary | ICD-10-CM

## 2014-02-10 DIAGNOSIS — Z8546 Personal history of malignant neoplasm of prostate: Secondary | ICD-10-CM | POA: Insufficient documentation

## 2014-02-10 DIAGNOSIS — Z6839 Body mass index (BMI) 39.0-39.9, adult: Secondary | ICD-10-CM | POA: Insufficient documentation

## 2014-02-10 DIAGNOSIS — Z7982 Long term (current) use of aspirin: Secondary | ICD-10-CM | POA: Insufficient documentation

## 2014-02-10 DIAGNOSIS — M1710 Unilateral primary osteoarthritis, unspecified knee: Secondary | ICD-10-CM | POA: Insufficient documentation

## 2014-02-10 DIAGNOSIS — M2419 Other articular cartilage disorders, other specified site: Principal | ICD-10-CM | POA: Insufficient documentation

## 2014-02-10 DIAGNOSIS — S76112D Strain of left quadriceps muscle, fascia and tendon, subsequent encounter: Secondary | ICD-10-CM

## 2014-02-10 DIAGNOSIS — E781 Pure hyperglyceridemia: Secondary | ICD-10-CM | POA: Insufficient documentation

## 2014-02-10 DIAGNOSIS — Z87891 Personal history of nicotine dependence: Secondary | ICD-10-CM | POA: Insufficient documentation

## 2014-02-10 DIAGNOSIS — E119 Type 2 diabetes mellitus without complications: Secondary | ICD-10-CM | POA: Insufficient documentation

## 2014-02-10 DIAGNOSIS — S76112A Strain of left quadriceps muscle, fascia and tendon, initial encounter: Secondary | ICD-10-CM

## 2014-02-10 DIAGNOSIS — M249 Joint derangement, unspecified: Secondary | ICD-10-CM | POA: Insufficient documentation

## 2014-02-10 DIAGNOSIS — I10 Essential (primary) hypertension: Secondary | ICD-10-CM | POA: Insufficient documentation

## 2014-02-10 DIAGNOSIS — S76119A Strain of unspecified quadriceps muscle, fascia and tendon, initial encounter: Secondary | ICD-10-CM

## 2014-02-10 DIAGNOSIS — S76119D Strain of unspecified quadriceps muscle, fascia and tendon, subsequent encounter: Secondary | ICD-10-CM

## 2014-02-10 DIAGNOSIS — IMO0002 Reserved for concepts with insufficient information to code with codable children: Secondary | ICD-10-CM

## 2014-02-10 HISTORY — DX: Unspecified osteoarthritis, unspecified site: M19.90

## 2014-02-10 HISTORY — PX: QUADRICEPS TENDON REPAIR: SHX756

## 2014-02-10 HISTORY — DX: Headache: R51

## 2014-02-10 LAB — GLUCOSE, CAPILLARY
Glucose-Capillary: 181 mg/dL — ABNORMAL HIGH (ref 70–99)
Glucose-Capillary: 149 mg/dL — ABNORMAL HIGH (ref 70–99)
Glucose-Capillary: 176 mg/dL — ABNORMAL HIGH (ref 70–99)
Glucose-Capillary: 200 mg/dL — ABNORMAL HIGH (ref 70–99)

## 2014-02-10 LAB — POCT I-STAT, CHEM 8
BUN: 18 mg/dL (ref 6–23)
CALCIUM ION: 1.19 mmol/L (ref 1.13–1.30)
CREATININE: 0.7 mg/dL (ref 0.50–1.35)
Chloride: 100 mEq/L (ref 96–112)
Glucose, Bld: 182 mg/dL — ABNORMAL HIGH (ref 70–99)
HCT: 43 % (ref 39.0–52.0)
HEMOGLOBIN: 14.6 g/dL (ref 13.0–17.0)
Potassium: 4 mEq/L (ref 3.7–5.3)
SODIUM: 140 meq/L (ref 137–147)
TCO2: 26 mmol/L (ref 0–100)

## 2014-02-10 SURGERY — REPAIR, TENDON, QUADRICEPS
Anesthesia: General | Site: Leg Upper | Laterality: Bilateral

## 2014-02-10 MED ORDER — METHOCARBAMOL 500 MG PO TABS
500.0000 mg | ORAL_TABLET | Freq: Four times a day (QID) | ORAL | Status: DC | PRN
  Administered 2014-02-10 – 2014-02-14 (×6): 500 mg via ORAL
  Filled 2014-02-10 (×7): qty 1

## 2014-02-10 MED ORDER — ATENOLOL 25 MG PO TABS
25.0000 mg | ORAL_TABLET | Freq: Every day | ORAL | Status: DC
  Administered 2014-02-11 – 2014-02-15 (×5): 25 mg via ORAL
  Filled 2014-02-10 (×5): qty 1

## 2014-02-10 MED ORDER — HYDROMORPHONE HCL PF 1 MG/ML IJ SOLN
INTRAMUSCULAR | Status: AC
  Administered 2014-02-10: 0.5 mg via INTRAVENOUS
  Filled 2014-02-10: qty 1

## 2014-02-10 MED ORDER — ONDANSETRON HCL 4 MG/2ML IJ SOLN
4.0000 mg | Freq: Four times a day (QID) | INTRAMUSCULAR | Status: DC | PRN
  Administered 2014-02-10 – 2014-02-12 (×3): 4 mg via INTRAVENOUS
  Filled 2014-02-10 (×3): qty 2

## 2014-02-10 MED ORDER — OXYCODONE-ACETAMINOPHEN 5-325 MG PO TABS
1.0000 | ORAL_TABLET | ORAL | Status: DC | PRN
Start: 2014-02-10 — End: 2014-02-15
  Administered 2014-02-11 – 2014-02-12 (×2): 1 via ORAL
  Administered 2014-02-12 – 2014-02-15 (×9): 2 via ORAL
  Filled 2014-02-10 (×4): qty 2
  Filled 2014-02-10: qty 1
  Filled 2014-02-10 (×6): qty 2

## 2014-02-10 MED ORDER — WARFARIN SODIUM 5 MG PO TABS: 5.0000 mg | ORAL_TABLET | Freq: Every day | ORAL | Status: AC

## 2014-02-10 MED ORDER — KETOROLAC TROMETHAMINE 15 MG/ML IJ SOLN
INTRAMUSCULAR | Status: AC
  Administered 2014-02-10: 15 mg
  Filled 2014-02-10: qty 1

## 2014-02-10 MED ORDER — SUCCINYLCHOLINE CHLORIDE 20 MG/ML IJ SOLN
INTRAMUSCULAR | Status: AC
  Filled 2014-02-10: qty 1

## 2014-02-10 MED ORDER — PROMETHAZINE HCL 25 MG/ML IJ SOLN: 12.5000 mg | Freq: Four times a day (QID) | INTRAMUSCULAR | Status: DC | PRN

## 2014-02-10 MED ORDER — PHENYLEPHRINE HCL 10 MG/ML IJ SOLN
INTRAMUSCULAR | Status: DC | PRN
  Administered 2014-02-10 (×2): 80 ug via INTRAVENOUS
  Administered 2014-02-10: 40 ug via INTRAVENOUS

## 2014-02-10 MED ORDER — LACTATED RINGERS IV SOLN
INTRAVENOUS | Status: DC
  Administered 2014-02-10: 11:00:00 via INTRAVENOUS

## 2014-02-10 MED ORDER — PROPOFOL 10 MG/ML IV BOLUS
INTRAVENOUS | Status: DC | PRN
  Administered 2014-02-10: 200 mg via INTRAVENOUS

## 2014-02-10 MED ORDER — BUPIVACAINE HCL (PF) 0.25 % IJ SOLN
INTRAMUSCULAR | Status: AC
  Filled 2014-02-10: qty 60

## 2014-02-10 MED ORDER — WARFARIN VIDEO: Freq: Once | Status: DC

## 2014-02-10 MED ORDER — HYDROMORPHONE HCL PF 1 MG/ML IJ SOLN
0.2500 mg | INTRAMUSCULAR | Status: DC | PRN
  Administered 2014-02-10 (×4): 0.5 mg via INTRAVENOUS

## 2014-02-10 MED ORDER — OXYCODONE-ACETAMINOPHEN 5-325 MG PO TABS: 1.0000 | ORAL_TABLET | ORAL | Status: DC | PRN

## 2014-02-10 MED ORDER — MIDAZOLAM HCL 2 MG/2ML IJ SOLN
INTRAMUSCULAR | Status: AC
  Filled 2014-02-10: qty 2

## 2014-02-10 MED ORDER — CHLORHEXIDINE GLUCONATE 4 % EX LIQD: 60.0000 mL | Freq: Once | CUTANEOUS | Status: DC

## 2014-02-10 MED ORDER — FENTANYL CITRATE 0.05 MG/ML IJ SOLN
INTRAMUSCULAR | Status: AC
  Filled 2014-02-10: qty 2

## 2014-02-10 MED ORDER — WARFARIN - PHARMACIST DOSING INPATIENT: Freq: Every day | Status: DC

## 2014-02-10 MED ORDER — LACTATED RINGERS IV SOLN
INTRAVENOUS | Status: DC | PRN
  Administered 2014-02-10 (×2): via INTRAVENOUS

## 2014-02-10 MED ORDER — METHOCARBAMOL 500 MG PO TABS
ORAL_TABLET | ORAL | Status: AC
  Filled 2014-02-10: qty 1

## 2014-02-10 MED ORDER — HYDROMORPHONE HCL PF 1 MG/ML IJ SOLN
INTRAMUSCULAR | Status: AC
  Filled 2014-02-10: qty 1

## 2014-02-10 MED ORDER — ROCURONIUM BROMIDE 50 MG/5ML IV SOLN
INTRAVENOUS | Status: AC
  Filled 2014-02-10: qty 1

## 2014-02-10 MED ORDER — LIDOCAINE HCL (CARDIAC) 20 MG/ML IV SOLN
INTRAVENOUS | Status: DC | PRN
  Administered 2014-02-10: 60 mg via INTRAVENOUS

## 2014-02-10 MED ORDER — MIDAZOLAM HCL 5 MG/5ML IJ SOLN
INTRAMUSCULAR | Status: DC | PRN
  Administered 2014-02-10: 2 mg via INTRAVENOUS

## 2014-02-10 MED ORDER — FENTANYL CITRATE 0.05 MG/ML IJ SOLN
INTRAMUSCULAR | Status: DC | PRN
Start: 2014-02-10 — End: 2014-02-10
  Administered 2014-02-10 (×3): 25 ug via INTRAVENOUS
  Administered 2014-02-10: 50 ug via INTRAVENOUS
  Administered 2014-02-10: 25 ug via INTRAVENOUS
  Administered 2014-02-10: 50 ug via INTRAVENOUS
  Administered 2014-02-10 (×2): 25 ug via INTRAVENOUS

## 2014-02-10 MED ORDER — CLINDAMYCIN PHOSPHATE 900 MG/50ML IV SOLN
900.0000 mg | INTRAVENOUS | Status: AC
  Administered 2014-02-10: 900 mg via INTRAVENOUS
  Filled 2014-02-10: qty 50

## 2014-02-10 MED ORDER — BUPIVACAINE HCL (PF) 0.25 % IJ SOLN
INTRAMUSCULAR | Status: DC | PRN
  Administered 2014-02-10 (×2): 30 mL

## 2014-02-10 MED ORDER — ONDANSETRON HCL 4 MG/2ML IJ SOLN
INTRAMUSCULAR | Status: DC | PRN
  Administered 2014-02-10: 4 mg via INTRAVENOUS

## 2014-02-10 MED ORDER — EPHEDRINE SULFATE 50 MG/ML IJ SOLN
INTRAMUSCULAR | Status: AC
  Filled 2014-02-10: qty 1

## 2014-02-10 MED ORDER — OXYCODONE-ACETAMINOPHEN 5-325 MG PO TABS: 1.0000 | ORAL_TABLET | Freq: Four times a day (QID) | ORAL | Status: AC | PRN

## 2014-02-10 MED ORDER — FENTANYL CITRATE 0.05 MG/ML IJ SOLN
100.0000 ug | Freq: Once | INTRAMUSCULAR | Status: AC
  Administered 2014-02-10: 100 ug via INTRAVENOUS

## 2014-02-10 MED ORDER — ROPIVACAINE HCL 5 MG/ML IJ SOLN
INTRAMUSCULAR | Status: DC | PRN
  Administered 2014-02-10 (×2): 10 mL via PERINEURAL

## 2014-02-10 MED ORDER — OXYCODONE HCL 5 MG PO TABS
ORAL_TABLET | ORAL | Status: AC
  Filled 2014-02-10: qty 1

## 2014-02-10 MED ORDER — BUPIVACAINE-EPINEPHRINE (PF) 0.5% -1:200000 IJ SOLN
INTRAMUSCULAR | Status: DC | PRN
  Administered 2014-02-10 (×2): 10 mL via PERINEURAL

## 2014-02-10 MED ORDER — METFORMIN HCL 500 MG PO TABS
500.0000 mg | ORAL_TABLET | Freq: Two times a day (BID) | ORAL | Status: DC
  Administered 2014-02-10 – 2014-02-15 (×10): 500 mg via ORAL
  Filled 2014-02-10 (×13): qty 1

## 2014-02-10 MED ORDER — FENTANYL CITRATE 0.05 MG/ML IJ SOLN
INTRAMUSCULAR | Status: AC
  Filled 2014-02-10: qty 5

## 2014-02-10 MED ORDER — GLYCOPYRROLATE 0.2 MG/ML IJ SOLN
INTRAMUSCULAR | Status: DC | PRN
  Administered 2014-02-10: .8 mg via INTRAVENOUS

## 2014-02-10 MED ORDER — LIDOCAINE HCL (CARDIAC) 20 MG/ML IV SOLN
INTRAVENOUS | Status: AC
  Filled 2014-02-10: qty 5

## 2014-02-10 MED ORDER — WARFARIN SODIUM 7.5 MG PO TABS
7.5000 mg | ORAL_TABLET | Freq: Once | ORAL | Status: AC
  Administered 2014-02-10: 7.5 mg via ORAL
  Filled 2014-02-10: qty 1

## 2014-02-10 MED ORDER — COUMADIN BOOK
Freq: Once | Status: AC
  Administered 2014-02-10: 21:00:00
  Filled 2014-02-10: qty 1

## 2014-02-10 MED ORDER — ONDANSETRON HCL 4 MG PO TABS
4.0000 mg | ORAL_TABLET | Freq: Four times a day (QID) | ORAL | Status: DC | PRN
  Administered 2014-02-12 (×2): 4 mg via ORAL
  Filled 2014-02-10 (×2): qty 1

## 2014-02-10 MED ORDER — HYDROMORPHONE HCL PF 1 MG/ML IJ SOLN
1.0000 mg | INTRAMUSCULAR | Status: DC | PRN
  Administered 2014-02-10 (×2): 1 mg via INTRAVENOUS
  Administered 2014-02-11: 2 mg via INTRAVENOUS
  Administered 2014-02-11 (×2): 1 mg via INTRAVENOUS
  Filled 2014-02-10: qty 1
  Filled 2014-02-10: qty 2
  Filled 2014-02-10 (×3): qty 1

## 2014-02-10 MED ORDER — CEFAZOLIN SODIUM-DEXTROSE 2-3 GM-% IV SOLR
2.0000 g | Freq: Four times a day (QID) | INTRAVENOUS | Status: AC
  Administered 2014-02-10 – 2014-02-11 (×3): 2 g via INTRAVENOUS
  Filled 2014-02-10 (×3): qty 50

## 2014-02-10 MED ORDER — INSULIN ASPART 100 UNIT/ML ~~LOC~~ SOLN
0.0000 [IU] | Freq: Three times a day (TID) | SUBCUTANEOUS | Status: DC
  Administered 2014-02-10 – 2014-02-11 (×2): 3 [IU] via SUBCUTANEOUS
  Administered 2014-02-12: 2 [IU] via SUBCUTANEOUS
  Administered 2014-02-12: 3 [IU] via SUBCUTANEOUS
  Administered 2014-02-12: 2 [IU] via SUBCUTANEOUS
  Administered 2014-02-13: 3 [IU] via SUBCUTANEOUS
  Administered 2014-02-13 – 2014-02-14 (×3): 2 [IU] via SUBCUTANEOUS
  Administered 2014-02-14: 3 [IU] via SUBCUTANEOUS
  Administered 2014-02-15: 2 [IU] via SUBCUTANEOUS

## 2014-02-10 MED ORDER — ONDANSETRON HCL 4 MG/2ML IJ SOLN
4.0000 mg | Freq: Once | INTRAMUSCULAR | Status: DC | PRN
Start: 2014-02-10 — End: 2014-02-10

## 2014-02-10 MED ORDER — MIDAZOLAM HCL 2 MG/2ML IJ SOLN
INTRAMUSCULAR | Status: AC
  Administered 2014-02-10: 2 mg via INTRAVENOUS
  Filled 2014-02-10: qty 2

## 2014-02-10 MED ORDER — DEXTROSE 5 % IV SOLN
10.0000 mg | INTRAVENOUS | Status: DC | PRN
  Administered 2014-02-10: 15 ug/min via INTRAVENOUS

## 2014-02-10 MED ORDER — OXYCODONE HCL 5 MG/5ML PO SOLN: 5.0000 mg | Freq: Once | ORAL | Status: AC | PRN

## 2014-02-10 MED ORDER — 0.9 % SODIUM CHLORIDE (POUR BTL) OPTIME
TOPICAL | Status: DC | PRN
  Administered 2014-02-10: 1000 mL

## 2014-02-10 MED ORDER — SODIUM CHLORIDE 0.9 % IV SOLN
INTRAVENOUS | Status: DC
  Administered 2014-02-10: 21:00:00 via INTRAVENOUS

## 2014-02-10 MED ORDER — MIDAZOLAM HCL 2 MG/2ML IJ SOLN
2.0000 mg | Freq: Once | INTRAMUSCULAR | Status: AC
  Administered 2014-02-10: 2 mg via INTRAVENOUS

## 2014-02-10 MED ORDER — METHOCARBAMOL 750 MG PO TABS: 750.0000 mg | ORAL_TABLET | Freq: Three times a day (TID) | ORAL | Status: AC | PRN

## 2014-02-10 MED ORDER — OXYCODONE HCL 5 MG PO TABS
5.0000 mg | ORAL_TABLET | Freq: Once | ORAL | Status: AC | PRN
  Administered 2014-02-10: 5 mg via ORAL

## 2014-02-10 MED ORDER — FENTANYL CITRATE 0.05 MG/ML IJ SOLN
INTRAMUSCULAR | Status: AC
  Administered 2014-02-10: 100 ug via INTRAVENOUS
  Filled 2014-02-10: qty 2

## 2014-02-10 MED ORDER — ONDANSETRON HCL 4 MG/2ML IJ SOLN
INTRAMUSCULAR | Status: AC
  Filled 2014-02-10: qty 2

## 2014-02-10 MED ORDER — KETOROLAC TROMETHAMINE 30 MG/ML IJ SOLN
15.0000 mg | Freq: Four times a day (QID) | INTRAMUSCULAR | Status: AC
Start: 2014-02-10 — End: 2014-02-11
  Administered 2014-02-10 – 2014-02-11 (×3): 15 mg via INTRAVENOUS
  Filled 2014-02-10 (×3): qty 1

## 2014-02-10 MED ORDER — METHOCARBAMOL 1000 MG/10ML IJ SOLN
500.0000 mg | Freq: Four times a day (QID) | INTRAMUSCULAR | Status: DC | PRN
  Filled 2014-02-10: qty 5

## 2014-02-10 MED ORDER — ROCURONIUM BROMIDE 100 MG/10ML IV SOLN
INTRAVENOUS | Status: DC | PRN
  Administered 2014-02-10: 50 mg via INTRAVENOUS
  Administered 2014-02-10: 10 mg via INTRAVENOUS

## 2014-02-10 MED ORDER — NEOSTIGMINE METHYLSULFATE 10 MG/10ML IV SOLN
INTRAVENOUS | Status: DC | PRN
  Administered 2014-02-10: 5 mg via INTRAVENOUS

## 2014-02-10 SURGICAL SUPPLY — 71 items
BANDAGE ELASTIC 4 VELCRO ST LF (GAUZE/BANDAGES/DRESSINGS) IMPLANT
BANDAGE ELASTIC 6 VELCRO ST LF (GAUZE/BANDAGES/DRESSINGS) ×6 IMPLANT
BANDAGE ESMARK 6X9 LF (GAUZE/BANDAGES/DRESSINGS) ×1 IMPLANT
BIT DRILL 7/64X5 DISP (BIT) ×3 IMPLANT
BNDG COHESIVE 4X5 TAN STRL (GAUZE/BANDAGES/DRESSINGS) ×6 IMPLANT
BNDG ESMARK 6X9 LF (GAUZE/BANDAGES/DRESSINGS) ×3
BNDG GAUZE ELAST 4 BULKY (GAUZE/BANDAGES/DRESSINGS) ×6 IMPLANT
COVER MAYO STAND STRL (DRAPES) ×3 IMPLANT
CUFF TOURNIQUET SINGLE 34IN LL (TOURNIQUET CUFF) IMPLANT
CUFF TOURNIQUET SINGLE 44IN (TOURNIQUET CUFF) ×6 IMPLANT
DRAPE EXTREMITY BILATERAL (DRAPE) ×3 IMPLANT
DRAPE INCISE IOBAN 66X45 STRL (DRAPES) ×6 IMPLANT
DRAPE U-SHAPE 47X51 STRL (DRAPES) ×6 IMPLANT
DRSG PAD ABDOMINAL 8X10 ST (GAUZE/BANDAGES/DRESSINGS) ×3 IMPLANT
DURAPREP 26ML APPLICATOR (WOUND CARE) ×6 IMPLANT
ELECT REM PT RETURN 9FT ADLT (ELECTROSURGICAL) ×3
ELECTRODE REM PT RTRN 9FT ADLT (ELECTROSURGICAL) ×1 IMPLANT
GAUZE XEROFORM 1X8 LF (GAUZE/BANDAGES/DRESSINGS) ×3 IMPLANT
GAUZE XEROFORM 5X9 LF (GAUZE/BANDAGES/DRESSINGS) ×6 IMPLANT
GLOVE BIOGEL PI IND STRL 6.5 (GLOVE) ×1 IMPLANT
GLOVE BIOGEL PI IND STRL 7.0 (GLOVE) ×1 IMPLANT
GLOVE BIOGEL PI IND STRL 7.5 (GLOVE) ×1 IMPLANT
GLOVE BIOGEL PI IND STRL 8 (GLOVE) ×2 IMPLANT
GLOVE BIOGEL PI INDICATOR 6.5 (GLOVE) ×2
GLOVE BIOGEL PI INDICATOR 7.0 (GLOVE) ×2
GLOVE BIOGEL PI INDICATOR 7.5 (GLOVE) ×2
GLOVE BIOGEL PI INDICATOR 8 (GLOVE) ×4
GLOVE ECLIPSE 7.5 STRL STRAW (GLOVE) ×12 IMPLANT
GLOVE SURG SS PI 7.0 STRL IVOR (GLOVE) ×6 IMPLANT
GOWN STRL REUS W/ TWL LRG LVL3 (GOWN DISPOSABLE) ×2 IMPLANT
GOWN STRL REUS W/ TWL XL LVL3 (GOWN DISPOSABLE) ×1 IMPLANT
GOWN STRL REUS W/TWL LRG LVL3 (GOWN DISPOSABLE) ×4
GOWN STRL REUS W/TWL XL LVL3 (GOWN DISPOSABLE) ×2
IMMOBILIZER KNEE 22 UNIV (SOFTGOODS) IMPLANT
KIT BASIN OR (CUSTOM PROCEDURE TRAY) ×3 IMPLANT
KIT ROOM TURNOVER OR (KITS) ×3 IMPLANT
MANIFOLD NEPTUNE II (INSTRUMENTS) IMPLANT
MARKER SKIN DUAL TIP RULER LAB (MISCELLANEOUS) ×3 IMPLANT
NEEDLE 22X1 1/2 (OR ONLY) (NEEDLE) IMPLANT
NS IRRIG 1000ML POUR BTL (IV SOLUTION) ×3 IMPLANT
PACK ORTHO EXTREMITY (CUSTOM PROCEDURE TRAY) ×3 IMPLANT
PAD ABD 8X10 STRL (GAUZE/BANDAGES/DRESSINGS) ×3 IMPLANT
PAD ARMBOARD 7.5X6 YLW CONV (MISCELLANEOUS) ×6 IMPLANT
PAD CAST 4YDX4 CTTN HI CHSV (CAST SUPPLIES) IMPLANT
PADDING CAST COTTON 4X4 STRL (CAST SUPPLIES)
PASSER SUT SWANSON 36MM LOOP (INSTRUMENTS) ×9 IMPLANT
SPONGE GAUZE 4X4 12PLY (GAUZE/BANDAGES/DRESSINGS) ×3 IMPLANT
SPONGE GAUZE 4X4 12PLY STER LF (GAUZE/BANDAGES/DRESSINGS) ×3 IMPLANT
SPONGE LAP 18X18 X RAY DECT (DISPOSABLE) ×3 IMPLANT
STAPLER VISISTAT 35W (STAPLE) ×6 IMPLANT
STOCKINETTE IMPERVIOUS 9X36 MD (GAUZE/BANDAGES/DRESSINGS) ×6 IMPLANT
SUCTION FRAZIER TIP 10 FR DISP (SUCTIONS) IMPLANT
SUT ETHIBOND 2 V 37 (SUTURE) IMPLANT
SUT FIBERWIRE #2 38 REV NDL BL (SUTURE) ×12
SUT FIBERWIRE #5 38 CONV NDL (SUTURE) ×3
SUT PDS AB 4-0 P3 18 (SUTURE) IMPLANT
SUT VIC AB 0 CT1 27 (SUTURE) ×4
SUT VIC AB 0 CT1 27XBRD ANBCTR (SUTURE) ×2 IMPLANT
SUT VIC AB 1 CT1 27 (SUTURE) ×4
SUT VIC AB 1 CT1 27XBRD ANBCTR (SUTURE) ×2 IMPLANT
SUT VIC AB 2-0 CTB1 (SUTURE) ×3 IMPLANT
SUTURE FIBERWR #5 38 CONV NDL (SUTURE) ×1 IMPLANT
SUTURE FIBERWR#2 38 REV NDL BL (SUTURE) ×4 IMPLANT
SYR CONTROL 10ML LL (SYRINGE) IMPLANT
TOWEL OR 17X24 6PK STRL BLUE (TOWEL DISPOSABLE) ×3 IMPLANT
TOWEL OR 17X26 10 PK STRL BLUE (TOWEL DISPOSABLE) ×3 IMPLANT
TUBE CONNECTING 12'X1/4 (SUCTIONS) ×1
TUBE CONNECTING 12X1/4 (SUCTIONS) ×2 IMPLANT
UNDERPAD 30X30 INCONTINENT (UNDERPADS AND DIAPERS) ×3 IMPLANT
WATER STERILE IRR 1000ML POUR (IV SOLUTION) IMPLANT
YANKAUER SUCT BULB TIP NO VENT (SUCTIONS) ×3 IMPLANT

## 2014-02-10 NOTE — Brief Op Note (Signed)
02/10/2014  2:24 PM  PATIENT:  William Guerra  63 y.o. male  PRE-OPERATIVE DIAGNOSIS:  BILATERAL QUADRICEP RUPTURE  POST-OPERATIVE DIAGNOSIS:  BILATERAL QUADRICEP RUPTURE  PROCEDURE:  Procedure(s): REPAIR QUADRICEP TENDON (Bilateral)  SURGEON:  Surgeon(s) and Role:    * Alta Corning, MD - Primary  PHYSICIAN ASSISTANT:   ASSISTANTS: bethune   ANESTHESIA:   general  EBL:     BLOOD ADMINISTERED:none  DRAINS: none   LOCAL MEDICATIONS USED:  MARCAINE     SPECIMEN:  No Specimen  DISPOSITION OF SPECIMEN:  N/A  COUNTS:  YES  TOURNIQUET:  * Missing tourniquet times found for documented tourniquets in log:  [id number] * Total Tourniquet Time Documented: Thigh (Right) - 55 minutes Total: Thigh (Right) - 55 minutes   DICTATION: .Other Dictation: Dictation Number [id number]  PLAN OF CARE: Admit to inpatient   PATIENT DISPOSITION:  PACU - hemodynamically stable.   Delay start of Pharmacological VTE agent (>24hrs) due to surgical blood loss or risk of bleeding: no

## 2014-02-10 NOTE — Anesthesia Procedure Notes (Addendum)
Anesthesia Regional Block:  Adductor canal block  Pre-Anesthetic Checklist: ,, timeout performed, Correct Patient, Correct Site, Correct Laterality, Correct Procedure, Correct Position, site marked, Risks and benefits discussed,  Surgical consent,  Pre-op evaluation,  At surgeon's request and post-op pain management  Laterality: Left and Lower  Prep: chloraprep       Needles:  Injection technique: Single-shot  Needle Type: Echogenic Needle     Needle Length: 9cm 9 cm Needle Gauge: 21 and 21 G    Additional Needles:  Procedures: ultrasound guided (picture in chart) Adductor canal block Narrative:  Start time: 02/10/2014 11:22 AM End time: 02/10/2014 11:28 AM Injection made incrementally with aspirations every 5 mL.  Performed by: Personally  Anesthesiologist: Lorrene Reid, MD   Anesthesia Regional Block:  Adductor canal block  Pre-Anesthetic Checklist: ,, timeout performed, Correct Patient, Correct Site, Correct Laterality, Correct Procedure, Correct Position, site marked, Risks and benefits discussed,  Surgical consent,  Pre-op evaluation,  At surgeon's request and post-op pain management  Laterality: Right and Lower  Prep: chloraprep       Needles:  Injection technique: Single-shot  Needle Type: Echogenic Needle     Needle Length: 9cm 9 cm Needle Gauge: 21 and 21 G    Additional Needles:  Procedures: ultrasound guided (picture in chart) Adductor canal block Narrative:  Start time: 02/10/2014 11:35 AM End time: 02/10/2014 11:40 AM Injection made incrementally with aspirations every 5 mL.  Performed by: Personally  Anesthesiologist: Lorrene Reid, MD

## 2014-02-10 NOTE — Transfer of Care (Signed)
Immediate Anesthesia Transfer of Care Note  Patient: William Guerra  Procedure(s) Performed: Procedure(s): REPAIR QUADRICEP TENDON (Bilateral)  Patient Location: PACU  Anesthesia Type:General  Level of Consciousness: awake, alert  and oriented  Airway & Oxygen Therapy: Patient Spontanous Breathing  Post-op Assessment: Report given to PACU RN  Post vital signs: Reviewed and stable  Complications: No apparent anesthesia complications

## 2014-02-10 NOTE — Op Note (Signed)
NAME:  William Guerra, William Guerra NO.:  [id number]  MEDICAL RECORD NO.:  [id number]  LOCATION:  5N11C                        FACILITY:  [facility]  PHYSICIAN:  Alta Corning, M.D.   DATE OF BIRTH:  Feb 09, 1951  DATE OF PROCEDURE:  02/10/2014 DATE OF DISCHARGE:                              OPERATIVE REPORT   PREOPERATIVE DIAGNOSIS:  Bilateral quadriceps tendon rupture, complete on the left and incomplete on the right.  POSTOPERATIVE DIAGNOSIS:  Bilateral quadriceps tendon rupture, complete on the left and incomplete on the right.  PROCEDURE: 1. Open reduction and internal fixation of right quadriceps tendon     with #5 and #2 FiberWire suture with retinacular repair. 2. It is going to be left open quadriceps tendon repair with #5 and #2     FiberWire with retinacular pair.  SURGEON:  Alta Corning, M.D.  ASSISTANT:  Gary Fleet, P.A.  ANESTHESIA:  General.  BRIEF HISTORY:  William Guerra is a 63 year old male, who fell on the stairs.  He unfortunately suffered bilateral quadriceps tendon ruptures. He was seen in emergency room where the left side was diagnosed and right side was not.  He was seen in the office of another orthopedist, who diagnosed him accurately and got an MRI, which improved to confirm the right side tear.  He was scheduled for surgery there.  He was the good dear friend of a patient of ours and called and asked if we could be involved in his care.  We discussed with him that he was certainly in great hands where he was.  They were moderately insistent that they wanted [place] to take care of him and at that point, we felt that it was appropriate that we get involved with his care and he was brought to the operating room for bilateral quadriceps tendon repair.  PROCEDURE IN DETAIL:  The patient was taken to the operating room after adequate anesthesia was obtained with general anesthetic.  The patient was placed supine on the operating table and both legs  were prepped and draped in usual sterile fashion.  Following this, the right leg was exsanguinated and blood pressure was inflated to 350 mmHg.  Following this, a midline incision was made.  Subcutaneous tissue dissected down to the level of  extensor mechanism.  What was identified was a portion of the superior rectus was intact.  A complete rectus below was torn and retracted.  There was a longitudinal split almost like at the medial 3rd and you could see this rectus tear.  There was a small portion left intact on the very medial side and the lateral side was ruptured as well.  We took a knife and relieved this small portion of fibers that were attached anteriorly and now we were able to really identify.  On that medial 3rd, we took a #2 FiberWire in a figure-of-eight fashion, we were able to close that rent as well as bringing this all the way down and extended the tendon.  A #5 FiberWire was passed centrally gathering the inferior portion of the rectus and superior portion of the rectus and a #2 was passed laterally gaining the superior and inferior portions of the  rectus medially and then the portion of the tendon laterally. Once the stitches were passed excellent repair of the clot had been achieved and the drill holes were now placed.  Four drill holes were placed through the patella and the sutures were passed and the sutures were then tied giving an excellent repair.  We could range it to about 60 under no tension, and at that point, we felt that he was well done and the attention was then turned to the lateral retinaculum, which was torn.  Medial retinaculum was actually intact.  Lateral retinaculum was repaired with 1 Vicryl running.  The skin was closed with 0 and 2-0 Vicryl and skin staples.  Sterile compressive dressing was applied. Tourniquet let down.  Attention was then turned to the left knee.  Left knee was exsanguinated.  Blood pressure tourniquet inflated at 350  mmHg.  A midline incision was made in the subcutaneous tissue and dissected down to the level of extensor mechanism.  The quad was completely torn as well as medial and lateral retinaculum.  A #5 FiberWire was passed centrally and #2 medial and laterally, and a 4 drill holes placed to the patella and these sutures were passed through, and then tied under attention.  The medial and lateral retinaculum were then repaired.  Once this was done, we could easily range this 90 without significant tension on the repair.  At this point, the wound was irrigated, suctioned dry, and closed in layers.  Sterile compressive dressing was applied.  The patient was taken to the recovery room where he was noted to be in satisfactory condition.  Estimated blood loss for the left side was minimal.     Alta Corning, M.D.     Corliss Skains  D:  02/10/2014  T:  02/10/2014  Job:  [id number]

## 2014-02-10 NOTE — Anesthesia Postprocedure Evaluation (Signed)
Anesthesia Post Note  Patient: William Guerra  Procedure(s) Performed: Procedure(s) (LRB): REPAIR QUADRICEP TENDON (Bilateral)  Anesthesia type: General  Patient location: PACU  Post pain: Pain level controlled and Adequate analgesia  Post assessment: Post-op Vital signs reviewed, Patient's Cardiovascular Status Stable, Respiratory Function Stable, Patent Airway and Pain level controlled  Last Vitals:  Filed Vitals:   02/10/14 1451  BP:   Pulse:   Temp: 36.4 C  Resp:     Post vital signs: Reviewed and stable  Level of consciousness: awake, alert  and oriented  Complications: No apparent anesthesia complications

## 2014-02-10 NOTE — H&P (Signed)
PREOPERATIVE H&P  Chief Complaint: bilat quad repture  HPI: William Guerra is a 63 y.o. male who presents for evaluation of bilat quad reptuer. It has been present for 3 days and has been worsening. He has failed conservative measures. Pain is rated as moderate.  Past Medical History  Diagnosis Date  . Hypertension   . Hypertriglyceridemia   . Type II diabetes mellitus   . Prostate cancer ~ 2005  . Anginal pain     "couple times recently" (05/18/2013). 02/09/14- no pain since 9/14  . Headache(784.0)   . Arthritis     knee   Past Surgical History  Procedure Laterality Date  . Back surgery    . Prostate surgery  ~ 2005    brachytherapy to the prostate  . Cholecystectomy  1990's  . Appendectomy  1978  . Lumbar disc surgery  1990's    "L5" (05/18/2013)  . Colonoscopy w/ polypectomy     History   Social History  . Marital Status: Married    Spouse Name: N/A    Number of Children: N/A  . Years of Education: N/A   Occupational History  . Pastor    Social History Main Topics  . Smoking status: Former Smoker -- 2.00 packs/day for 16 years    Types: Cigarettes    Quit date: 08/25/1990  . Smokeless tobacco: Never Used  . Alcohol Use: No  . Drug Use: No  . Sexual Activity: Yes   Other Topics Concern  . None   Social History Narrative   Pt lives with wife. Tries to exercise.    History reviewed. No pertinent family history. Allergies  Allergen Reactions  . Enalapril Rash  . Neosporin [Neomycin-Bacitracin Zn-Polymyx] Rash   Prior to Admission medications   Medication Sig Start Date End Date Taking? Authorizing Solace Wendorff  aspirin 81 MG tablet Take 81 mg by mouth 2 (two) times daily.   Yes Historical Sherice Ijames, MD  atenolol (TENORMIN) 25 MG tablet Take 25 mg by mouth daily.   Yes Historical Railee Bonillas, MD  CINNAMON PO Take 2 tablets by mouth 2 (two) times daily.   Yes Historical Darnell Stimson, MD  Lactobacillus (ACIDOPHILUS) CAPS capsule Take 1 capsule by mouth daily.   Yes  Historical Karle Desrosier, MD  metFORMIN (GLUCOPHAGE) 500 MG tablet Take 500 mg by mouth 2 (two) times daily with a meal.   Yes Historical Shaquia Berkley, MD  Multiple Vitamins-Minerals (MULTIVITAMIN WITH MINERALS) tablet Take 1 tablet by mouth daily.   Yes Historical Eliza Grissinger, MD  Omega-3 Fatty Acids (FISH OIL PO) Take 2 capsules by mouth 2 (two) times daily.    Yes Historical Miciah Covelli, MD  oxyCODONE-acetaminophen (PERCOCET) 5-325 MG per tablet Take 1-2 tablets by mouth every 4 (four) hours as needed for severe pain. 02/05/14  Yes Orlie Dakin, MD  simvastatin (ZOCOR) 40 MG tablet Take 40 mg by mouth daily.   Yes Historical Thane Age, MD     Positive ROS: none  All other systems have been reviewed and were otherwise negative with the exception of those mentioned in the HPI and as above.  Physical Exam: There were no vitals filed for this visit.  General: Alert, no acute distress Cardiovascular: No pedal edema Respiratory: No cyanosis, no use of accessory musculature GI: No organomegaly, abdomen is soft and non-tender Skin: No lesions in the area of chief complaint Neurologic: Sensation intact distally Psychiatric: Patient is competent for consent with normal mood and affect Lymphatic: No axillary or cervical lymphadenopathy  MUSCULOSKELETAL: r and l  leg with defect in area of quad  Assessment/Plan: bilateral quad tendon repair Plan for Procedure(s): REPAIR QUADRICEP TENDON  The risks benefits and alternatives were discussed with the patient including but not limited to the risks of nonoperative treatment, versus surgical intervention including infection, bleeding, nerve injury, malunion, nonunion, hardware prominence, hardware failure, need for hardware removal, blood clots, cardiopulmonary complications, morbidity, mortality, among others, and they were willing to proceed.  Predicted outcome is good, although there will be at least a six to nine month expected recovery.  GRAVES,JOHN L,  MD 02/10/2014 10:45 AM

## 2014-02-10 NOTE — Consult Note (Signed)
ANTICOAGULATION CONSULT NOTE - Initial Consult  Pharmacy Consult for Coumadin Indication: VTE prophylaxis  Allergies  Allergen Reactions  . Enalapril Rash  . Neosporin [Neomycin-Bacitracin Zn-Polymyx] Rash    Patient Measurements: Height: 6\' 3"  (190.5 cm) Weight: 319 lb (144.697 kg) IBW/kg (Calculated) : 84.5  Vital Signs: Temp: 97.6 F (36.4 C) (06/19 1707) Temp src: Oral (06/19 1707) BP: 132/70 mmHg (06/19 1707) Pulse Rate: 78 (06/19 1707)  Labs:  Recent Labs  02/10/14 1112  HGB 14.6  HCT 43.0  CREATININE 0.70    Estimated Creatinine Clearance: 147.1 ml/min (by C-G formula based on Cr of 0.7).   Medical History: Past Medical History  Diagnosis Date  . Hypertension   . Hypertriglyceridemia   . Type II diabetes mellitus   . Prostate cancer ~ 2005  . Anginal pain     "couple times recently" (05/18/2013). 02/09/14- no pain since 9/14  . Headache(784.0)   . Arthritis     knee   Assessment: 63yom s/p bilateral quadricep tendon repair to begin coumadin for VTE prophylaxis x 1 month. Coumadin score = 6.   Goal of Therapy:  INR 1.5-2 Monitor platelets by anticoagulation protocol: Yes   Plan:  1) Coumadin 7.5mg  x 1 tonight 2) Daily INR 3) Will initiate coumadin education with book and video  Deboraha Sprang 02/10/2014,5:32 PM

## 2014-02-10 NOTE — Anesthesia Preprocedure Evaluation (Addendum)
Anesthesia Evaluation  Patient identified by MRN, date of birth, ID band Patient awake    Reviewed: Allergy & Precautions, H&P , NPO status , Patient's Chart, lab work & pertinent test results, reviewed documented beta blocker date and time   Airway Mallampati: I TM Distance: >3 FB Neck ROM: Full    Dental  (+) Teeth Intact, Dental Advisory Given   Pulmonary former smoker,  breath sounds clear to auscultation        Cardiovascular hypertension, Pt. on medications and Pt. on home beta blockers Rhythm:Regular Rate:Normal     Neuro/Psych    GI/Hepatic   Endo/Other  diabetes, Well Controlled, Type 2, Oral Hypoglycemic AgentsMorbid obesity  Renal/GU      Musculoskeletal   Abdominal   Peds  Hematology   Anesthesia Other Findings   Reproductive/Obstetrics                          Anesthesia Physical Anesthesia Plan  ASA: III  Anesthesia Plan: General   Post-op Pain Management:    Induction: Intravenous  Airway Management Planned: Oral ETT  Additional Equipment:   Intra-op Plan:   Post-operative Plan: Extubation in OR  Informed Consent: I have reviewed the patients History and Physical, chart, labs and discussed the procedure including the risks, benefits and alternatives for the proposed anesthesia with the patient or authorized representative who has indicated his/her understanding and acceptance.   Dental advisory given  Plan Discussed with: CRNA, Anesthesiologist and Surgeon  Anesthesia Plan Comments:         Anesthesia Quick Evaluation

## 2014-02-11 LAB — GLUCOSE, CAPILLARY
Glucose-Capillary: 107 mg/dL — ABNORMAL HIGH (ref 70–99)
Glucose-Capillary: 152 mg/dL — ABNORMAL HIGH (ref 70–99)
Glucose-Capillary: 141 mg/dL — ABNORMAL HIGH (ref 70–99)
Glucose-Capillary: 148 mg/dL — ABNORMAL HIGH (ref 70–99)

## 2014-02-11 LAB — PROTIME-INR
INR: 1.09 (ref 0.00–1.49)
PROTHROMBIN TIME: 13.9 s (ref 11.6–15.2)

## 2014-02-11 MED ORDER — WARFARIN SODIUM 7.5 MG PO TABS
7.5000 mg | ORAL_TABLET | Freq: Once | ORAL | Status: AC
  Administered 2014-02-11: 7.5 mg via ORAL
  Filled 2014-02-11: qty 1

## 2014-02-11 NOTE — Discharge Summary (Deleted)
Patient ID: William Guerra MRN: [id number] DOB/AGE: [birth year] 63 y.o.  Admit date: 02/10/2014 Discharge date: 02/11/2014  Admission Diagnoses:  Principal Problem:   Traumatic rupture of left quadriceps tendon Active Problems:   Rupture of right quadriceps tendon   Quadriceps tendon rupture   Discharge Diagnoses:  Same  Past Medical History  Diagnosis Date  . Hypertension   . Hypertriglyceridemia   . Type II diabetes mellitus   . Prostate cancer ~ 2005  . Anginal pain     "couple times recently" (05/18/2013). 02/09/14- no pain since 9/14  . Headache(784.0)   . Arthritis     knee    Surgeries: Procedure(s): REPAIR QUADRICEP TENDON on 02/10/2014   Consultants:    Discharged Condition: Improved  Hospital Course: William Guerra is an 63 y.o. male who was admitted 02/10/2014 for operative treatment ofTraumatic rupture of left quadriceps tendon. Patient has severe unremitting pain that affects sleep, daily activities, and work/hobbies. After pre-op clearance the patient was taken to the operating room on 02/10/2014 and underwent  Procedure(s): REPAIR QUADRICEP TENDON.    Patient was given perioperative antibiotics: Anti-infectives   Start     Dose/Rate Route Frequency Ordered Stop   02/10/14 1800  ceFAZolin (ANCEF) IVPB 2 g/50 mL premix     2 g 100 mL/hr over 30 Minutes Intravenous Every 6 hours 02/10/14 1520 02/11/14 0657   02/10/14 1100  clindamycin (CLEOCIN) IVPB 900 mg     900 mg 100 mL/hr over 30 Minutes Intravenous On call to O.R. 02/10/14 1047 02/10/14 1235       Patient was given sequential compression devices, early ambulation, and chemoprophylaxis to prevent DVT.  Patient benefited maximally from hospital stay and there were no complications.    Recent vital signs: Patient Vitals for the past 24 hrs:  BP Temp Temp src Pulse Resp SpO2 Height Weight  02/11/14 0621 116/56 mmHg 98.8 F (37.1 C) Oral 89 18 97 % - -  02/11/14 0214 98/52 mmHg 98.2 F (36.8 C) Oral  92 18 94 % - -  02/10/14 1956 113/61 mmHg 97.8 F (36.6 C) Oral 95 16 92 % - -  02/10/14 1707 132/70 mmHg 97.6 F (36.4 C) Oral 78 12 95 % - -  02/10/14 1645 119/46 mmHg - - - - - - -  02/10/14 1630 125/58 mmHg 97.6 F (36.4 C) - - - - - -  02/10/14 1515 110/58 mmHg - - 104 19 92 % - -  02/10/14 1500 124/58 mmHg - - 103 23 91 % - -  02/10/14 1451 119/53 mmHg 97.6 F (36.4 C) - - - - - -  02/10/14 1149 - - - 72 12 97 % - -  02/10/14 1148 - - - 75 13 98 % - -  02/10/14 1147 - - - 73 13 96 % - -  02/10/14 1146 - - - 72 12 97 % - -  02/10/14 1145 - - - 71 12 97 % - -  02/10/14 1144 - - - 74 14 97 % - -  02/10/14 1143 - - - 71 13 98 % - -  02/10/14 1142 - - - 72 11 99 % - -  02/10/14 1141 120/64 mmHg - - 78 18 98 % - -  02/10/14 1140 - - - 73 12 98 % - -  02/10/14 1139 - - - 72 14 97 % - -  02/10/14 1138 - - - 70 11 99 % - -  02/10/14  1137 - - - 73 13 97 % - -  02/10/14 1136 134/75 mmHg - - 77 13 97 % - -  02/10/14 1135 - - - 78 21 98 % - -  02/10/14 1134 - - - 73 15 98 % - -  02/10/14 1133 - - - 73 11 98 % - -  02/10/14 1132 - - - 74 11 97 % - -  02/10/14 1131 120/68 mmHg - - 71 10 96 % - -  02/10/14 1130 129/84 mmHg 98.3 F (36.8 C) Oral 74 16 96 % 6\' 3"  (1.905 m) 144.697 kg (319 lb)  02/10/14 1129 - - - 72 11 97 % - -  02/10/14 1128 - - - 73 11 97 % - -  02/10/14 1127 - - - 71 12 97 % - -  02/10/14 1126 122/63 mmHg - - 73 10 97 % - -  02/10/14 1125 - - - 73 11 97 % - -  02/10/14 1124 - - - 77 12 98 % - -  02/10/14 1123 - - - 75 12 92 % - -  02/10/14 1122 - - - 74 12 94 % - -  02/10/14 1121 119/59 mmHg - - 71 15 94 % - -  02/10/14 1120 - - - 73 12 97 % - -  02/10/14 1119 - - - 77 19 98 % - -  02/10/14 1118 - - - 72 20 98 % - -  02/10/14 1117 129/84 mmHg - - 73 14 98 % - -  02/10/14 1116 - - - 77 20 98 % - -  02/10/14 1115 - - - 75 11 99 % - -  02/10/14 1114 - - - 73 13 98 % - -  02/10/14 1113 - - - 80 15 97 % - -  02/10/14 1112 - - - 73 15 95 % - -  02/10/14 1111 - - -  77 - 96 % - -  02/10/14 1101 - - - 77 - 96 % - -     Recent laboratory studies:  Recent Labs  02/10/14 1112 02/11/14 0510  HGB 14.6  --   HCT 43.0  --   NA 140  --   K 4.0  --   CL 100  --   BUN 18  --   CREATININE 0.70  --   GLUCOSE 182*  --   INR  --  1.09     Discharge Medications:     Medication List    STOP taking these medications       aspirin 81 MG tablet      TAKE these medications       Acidophilus Caps capsule  Take 1 capsule by mouth daily.     atenolol 25 MG tablet  Commonly known as:  TENORMIN  Take 25 mg by mouth daily.     CINNAMON PO  Take 2 tablets by mouth 2 (two) times daily.     FISH OIL PO  Take 2 capsules by mouth 2 (two) times daily.     metFORMIN 500 MG tablet  Commonly known as:  GLUCOPHAGE  Take 500 mg by mouth 2 (two) times daily with a meal.     methocarbamol 750 MG tablet  Commonly known as:  ROBAXIN-750  Take 1 tablet (750 mg total) by mouth every 8 (eight) hours as needed for muscle spasms.     multivitamin with minerals tablet  Take 1 tablet by mouth daily.  oxyCODONE-acetaminophen 5-325 MG per tablet  Commonly known as:  PERCOCET  Take 1-2 tablets by mouth every 4 (four) hours as needed for severe pain.     oxyCODONE-acetaminophen 5-325 MG per tablet  Commonly known as:  PERCOCET/ROXICET  Take 1-2 tablets by mouth every 6 (six) hours as needed for severe pain.     simvastatin 40 MG tablet  Commonly known as:  ZOCOR  Take 40 mg by mouth daily.     warfarin 5 MG tablet  Commonly known as:  COUMADIN  Take 1 tablet (5 mg total) by mouth daily. Unless otherwise directed. Take x 1 month post op.        Diagnostic Studies: Dg Knee Complete 4 Views Left  02/05/2014   CLINICAL DATA:  Left knee pain  EXAM: LEFT KNEE - COMPLETE 4+ VIEW  COMPARISON:  None  FINDINGS: Moderate joint effusion identified. No fracture or dislocation identified. There is no evidence of arthropathy or other focal bone abnormality. Soft  tissues are unremarkable.  IMPRESSION: 1. No acute bone abnormality. 2. Joint effusion.   Electronically Signed   By: Kerby Moors M.D.   On: 02/05/2014 12:24   Dg Knee Complete 4 Views Right  02/05/2014   CLINICAL DATA:  Pain.  EXAM: RIGHT KNEE - COMPLETE 4+ VIEW  COMPARISON:  None.  FINDINGS: No acute bony or joint abnormality. Diffuse degenerative change. No evidence of fracture or dislocation.  IMPRESSION: DJD.  No acute abnormality.   Electronically Signed   By: Marcello Moores  Register   On: 02/05/2014 12:23    Disposition: 01-Home or Self Care      Discharge Instructions   Call MD / Call 911    Complete by:  As directed   If you experience chest pain or shortness of breath, CALL 911 and be transported to the hospital emergency room.  If you develope a fever above 101 F, pus (white drainage) or increased drainage or redness at the wound, or calf pain, call your surgeon's office.     Change dressing    Complete by:  As directed   Change dressing on 5, then change the dressing daily with sterile 4 x 4 inch gauze dressing and apply TED hose.  You may clean the incision with alcohol prior to redressing.     Constipation Prevention    Complete by:  As directed   Drink plenty of fluids.  Prune juice may be helpful.  You may use a stool softener, such as Colace (over the counter) 100 mg twice a day.  Use MiraLax (over the counter) for constipation as needed.     Diet - low sodium heart healthy    Complete by:  As directed      Discharge instructions    Complete by:  As directed   Follow up with Dr. Berenice Primas in office in 10-14 days     Driving restrictions    Complete by:  As directed   No driving for 2 weeks     Increase activity slowly as tolerated    Complete by:  As directed      Patient may shower    Complete by:  As directed   You may shower without a dressing once there is no drainage.  Do not wash over the wound.  If drainage remains, cover wound with plastic wrap and then shower.      Weight bearing as tolerated    Complete by:  As directed   Laterality:  bilateral  Extremity:  Lower           Follow-up Information   Follow up with GRAVES,JOHN L, MD. Schedule an appointment as soon as possible for a visit in 2 weeks.   Specialty:  Orthopedic Surgery   Contact information:   [place] [address] [phone]        Signed: Theodosia Quay 02/11/2014, 8:42 AM

## 2014-02-11 NOTE — Progress Notes (Signed)
PATIENT ID: ARHUM PEEPLES  MRN: [id number]  DOB/AGE:  June 10, 1951 / 63 y.o.  1 Day Post-Op Procedure(s) (LRB): REPAIR QUADRICEP TENDON (Bilateral)    PROGRESS NOTE Subjective:   Patient is alert, oriented, no Nausea, no Vomiting, yes passing gas, no Bowel Movement. Taking PO well. Denies SOB, Chest or Calf Pain. Using Incentive Spirometer, PAS in place. Ambulate WBAT with knee immoblizer, Patient reports pain as 0 on 0-10 scale,     Objective: Vital signs in last 24 hours: Temp:  [97.6 F (36.4 C)-98.8 F (37.1 C)] 98.8 F (37.1 C) (06/20 0621) Pulse Rate:  [70-104] 89 (06/20 0621) Resp:  [10-23] 18 (06/20 0621) BP: (98-134)/(46-84) 116/56 mmHg (06/20 0621) SpO2:  [91 %-99 %] 97 % (06/20 0621) Weight:  [144.697 kg (319 lb)] 144.697 kg (319 lb) (06/19 1130)    Intake/Output from previous day: I/O last 3 completed shifts: In: 1610 [P.O.:360; I.V.:1200; IV Piggyback:50] Out: 750 [Urine:750]   Intake/Output this shift:     LABORATORY DATA:  Recent Labs  02/10/14 1112  02/10/14 1841 02/10/14 2044 02/11/14 0510 02/11/14 0704  HGB 14.6  --   --   --   --   --   HCT 43.0  --   --   --   --   --   NA 140  --   --   --   --   --   K 4.0  --   --   --   --   --   CL 100  --   --   --   --   --   BUN 18  --   --   --   --   --   CREATININE 0.70  --   --   --   --   --   GLUCOSE 182*  --   --   --   --   --   GLUCAP  --   < > 176* 200*  --  107*  INR  --   --   --   --  1.09  --   < > = values in this interval not displayed.  Examination: Neurologically intact ABD soft Neurovascular intact Sensation intact distally Intact pulses distally Dorsiflexion/Plantar flexion intact}  Assessment:   1 Day Post-Op Procedure(s) (LRB): REPAIR QUADRICEP TENDON (Bilateral) ADDITIONAL DIAGNOSIS:   Plan:  Weight Bearing as Tolerated (WBAT) with knee immobilizers  DVT Prophylaxis:  Coumadin x 1 month  DISCHARGE PLAN: Home today after therapy  DISCHARGE NEEDS: HHPT, HHRN, Walker  and 3-in-1 comode seat     PHILLIPS, ERIC R 02/11/2014, 8:36 AM

## 2014-02-11 NOTE — Progress Notes (Signed)
Utilization review complete 

## 2014-02-11 NOTE — Care Management Note (Signed)
    Page 1 of 1   02/11/2014     12:52:47 PM CARE MANAGEMENT NOTE 02/11/2014  Patient:  William Guerra, William Guerra   Account Number:  [id number]  Date Initiated:  02/11/2014  Documentation initiated by:  [facility]  Subjective/Objective Assessment:   adm: Traumatic rupture of left quadriceps tendon; Open reduction and internal fixation of right quadriceps tendon      with #5 and #2 FiberWire suture with retinacular repair.  2. It is going to be left open quadriceps tendon repair with #5     Action/Plan:   discharge planning   Anticipated DC Date:  02/13/2014   Anticipated DC Plan:  [place]  CM consult      Choice offered to / List presented to:             Status of service:  In process, will continue to follow Medicare Important Message given?   (If response is "NO", the following Medicare IM given date [place] will be blank) Date Medicare IM given:   Date Additional Medicare IM given:    Discharge Disposition:  IP REHAB FACILITY  Per UR Regulation:    If discussed at Long Length of Stay Meetings, dates discussed:    Comments:  02/11/14 12:30 CM received call from RN requesting arrangement of IR.  CM called Pamala Hurry in [place] who stated she will place pt on referral list for Monday Morning 02/13/14. CM called pt to inform of Monday's impending consult with CIR. No other CM needs were communicated.  Mariane Masters, BSN, CM [phone].

## 2014-02-11 NOTE — Progress Notes (Signed)
Orthopedic Tech Progress Note Patient Details:  William Guerra 17-Mar-1951 [id number] OHF applied to bed Patient ID: [name], male   DOB: 1951/02/17, 63 y.o.   MRN: [id number]   [name] 02/11/2014, 12:29 PM

## 2014-02-11 NOTE — Discharge Instructions (Signed)
Ambulate weight bearing as tolerated on both lower extremities with knee immobilizers and a walker. Keep your surgical wounds dry until after you come to the office. You may apply ice to your knees under the knee immobilizers. Do ankle pump exercises and to continue to wear your TED stockings. ___________________________________ Information on my medicine - Coumadin   (Warfarin)  This medication education was reviewed with me or my healthcare representative as part of my discharge preparation.  The pharmacist that spoke with me during my hospital stay was:  Lynelle Doctor, [facility]  Why was Coumadin prescribed for you? Coumadin was prescribed for you because you have a blood clot or a medical condition that can cause an increased risk of forming blood clots. Blood clots can cause serious health problems by blocking the flow of blood to the heart, lung, or brain. Coumadin can prevent harmful blood clots from forming. As a reminder your indication for Coumadin is:   Blood Clot Prevention After Orthopedic Surgery  What test will check on my response to Coumadin? While on Coumadin (warfarin) you will need to have an INR test regularly to ensure that your dose is keeping you in the desired range. The INR (international normalized ratio) number is calculated from the result of the laboratory test called prothrombin time (PT).  If an INR APPOINTMENT HAS NOT ALREADY BEEN MADE FOR YOU please schedule an appointment to have this lab work done by your health care provider within 7 days. Your INR goal is: 1.5-2  What  do you need to  know  About  COUMADIN? Take Coumadin (warfarin) exactly as prescribed by your healthcare provider about the same time each day.  DO NOT stop taking without talking to the doctor who prescribed the medication.  Stopping without other blood clot prevention medication to take the place of Coumadin may increase your risk of developing a new clot or stroke.  Get refills before you run  out.  What do you do if you miss a dose? If you miss a dose, take it as soon as you remember on the same day then continue your regularly scheduled regimen the next day.  Do not take two doses of Coumadin at the same time.  Important Safety Information A possible side effect of Coumadin (Warfarin) is an increased risk of bleeding. You should call your healthcare provider right away if you experience any of the following:   Bleeding from an injury or your nose that does not stop.   Unusual colored urine (red or dark brown) or unusual colored stools (red or black).   Unusual bruising for unknown reasons.   A serious fall or if you hit your head (even if there is no bleeding).  Some foods or medicines interact with Coumadin (warfarin) and might alter your response to warfarin. To help avoid this:   Eat a balanced diet, maintaining a consistent amount of Vitamin K.   Notify your provider about major diet changes you plan to make.   Avoid alcohol or limit your intake to 1 drink for women and 2 drinks for men per day. (1 drink is 5 oz. wine, 12 oz. beer, or 1.5 oz. liquor.)  Make sure that ANY health care provider who prescribes medication for you knows that you are taking Coumadin (warfarin).  Also make sure the healthcare provider who is monitoring your Coumadin knows when you have started a new medication including herbals and non-prescription products.  Coumadin (Warfarin)  Major Drug Interactions  Increased  Warfarin Effect Decreased Warfarin Effect  Alcohol (large quantities) Antibiotics (esp. Septra/Bactrim, Flagyl, Cipro) Amiodarone (Cordarone) Aspirin (ASA) Cimetidine (Tagamet) Megestrol (Megace) NSAIDs (ibuprofen, naproxen, etc.) Piroxicam (Feldene) Propafenone (Rythmol SR) Propranolol (Inderal) Isoniazid (INH) Posaconazole (Noxafil) Barbiturates (Phenobarbital) Carbamazepine (Tegretol) Chlordiazepoxide (Librium) Cholestyramine (Questran) Griseofulvin Oral  Contraceptives Rifampin Sucralfate (Carafate) Vitamin K   Coumadin (Warfarin) Major Herbal Interactions  Increased Warfarin Effect Decreased Warfarin Effect  Garlic Ginseng Ginkgo biloba Coenzyme Q10 Green tea St. Johns wort    Coumadin (Warfarin) FOOD Interactions  Eat a consistent number of servings per week of foods HIGH in Vitamin K (1 serving =  cup)  Collards (cooked, or boiled & drained) Kale (cooked, or boiled & drained) Mustard greens (cooked, or boiled & drained) Parsley *serving size only =  cup Spinach (cooked, or boiled & drained) Swiss chard (cooked, or boiled & drained) Turnip greens (cooked, or boiled & drained)  Eat a consistent number of servings per week of foods MEDIUM-HIGH in Vitamin K (1 serving = 1 cup)  Asparagus (cooked, or boiled & drained) Broccoli (cooked, boiled & drained, or raw & chopped) Brussel sprouts (cooked, or boiled & drained) *serving size only =  cup Lettuce, raw (green leaf, endive, romaine) Spinach, raw Turnip greens, raw & chopped   These websites have more information on Coumadin (warfarin):  FailFactory.se; VeganReport.com.au;

## 2014-02-11 NOTE — Evaluation (Signed)
Physical Therapy Evaluation Patient Details Name: William Guerra MRN: [id number] DOB: 10/06/1950 Today's Date: 02/11/2014   History of Present Illness  Pt with PMH of DM, prostate cancer, remote back surgery. Fell down stairs at home sustaining bilateral quad rupture, underwent tendon repair 02/10/14.  Clinical Impression  Pt admitted with significant mobility limitations due to inability to flex bilateral knees for the next several months. Pt currently with functional limitations due to the deficits listed below (see PT Problem List). Pt ambulated 12' with RW and +2 min A but is requiring +2 max A for bed mobility and transfers.  Pt will benefit from skilled PT to increase their independence and safety with mobility to allow discharge to the venue listed below. PT will continue to follow.      Follow Up Recommendations CIR;Supervision for mobility/OOB (pt would like to go to rehab at [facility])    Equipment Recommendations  Rolling walker with 5" wheels;Wheelchair (measurements PT);Hospital bed (bariatric wide RW, lift chair)    Recommendations for Other Services Rehab consult     Precautions / Restrictions Precautions Precautions: Fall Required Braces or Orthoses: Knee Immobilizer - Right;Knee Immobilizer - Left Knee Immobilizer - Right: On at all times Knee Immobilizer - Left: On at all times Restrictions Weight Bearing Restrictions: Yes      Mobility  Bed Mobility Overal bed mobility: Needs Assistance;+2 for physical assistance Bed Mobility: Sit to Supine;Supine to Sit     Supine to sit: +2 for physical assistance;Mod assist Sit to supine: +2 for physical assistance;Max assist   General bed mobility comments: pt able to roll to side with min A then 1 person to help legs off bed and one to help him elevate trunk from mattress and scoot hipe fwd to EOB. Legs cannot be lowered until pt all the way to edge of bed due to KI's so will need to have at least min A at home.  More difficulty getting back into bed b/c pt could not scoot self back on mattress using UE's and no push from elgs due to legs in extension. Required max A at trunk to assist in lowering upper body to bed while 2nd person lifted legs and pivoted him into bed.   Transfers Overall transfer level: Needs assistance Equipment used: Rolling walker (2 wheeled) Transfers: Sit to/from Stand Sit to Stand: +2 physical assistance;Mod assist         General transfer comment: elevated bed to high position so that pt could walk legs under him and then push up with hands on RW. +2 assist for support and safety. Getting back down to the bed was harder because pt could not sit hips back into bed and from edge did not have the upper body strength to scoot self back into bed without use of legs. Had to go straight to SL with +2 assist.  Ambulation/Gait Ambulation/Gait assistance: +2 safety/equipment;Min assist Ambulation Distance (Feet): 12 Feet Assistive device: Rolling walker (2 wheeled) Gait Pattern/deviations: Step-through pattern;Decreased stride length;Wide base of support Gait velocity: decreased   General Gait Details: pt was able to ambulate with circumduction pattern and did very well with this. He would benefit from a bariatric RW for his wt but also to give him a wider area to swing each leg out since he cannot flex knees.  Stairs            Wheelchair Mobility    Modified Rankin (Stroke Patients Only)       Balance Overall balance  assessment: Needs assistance Sitting-balance support: Feet supported Sitting balance-Leahy Scale: Normal     Standing balance support: Bilateral upper extremity supported;During functional activity Standing balance-Leahy Scale: Poor Standing balance comment: decreased balance reactions with bilateral knees in full extension, increased fall risk                             Pertinent Vitals/Pain No pain on eval, VSS    Home Living  Family/patient expects to be discharged to:: Private residence Living Arrangements: Spouse/significant other Available Help at Discharge: Family;Available 24 hours/day Type of Home: House Home Access: Ramped entrance     Home Layout: Two level Home Equipment: Bedside commode Additional Comments: wife in a retired Marine scientist and can give him min A but he is 6'3", 319 lbs so will have to be nearly independent to go home    Prior Function Level of Independence: Independent         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     Hand Dominance        Extremity/Trunk Assessment   Upper Extremity Assessment: Overall WFL for tasks assessed           Lower Extremity Assessment: RLE deficits/detail;LLE deficits/detail RLE Deficits / Details: no knee ROM allowed, ankle WFL, hip WFL but difficulty moving leg with KI and educated pt on avoiding flexion stress LLE Deficits / Details: same as RLE  Cervical / Trunk Assessment: Normal  Communication   Communication: No difficulties  Cognition Arousal/Alertness: Awake/alert Behavior During Therapy: WFL for tasks assessed/performed Overall Cognitive Status: Within Functional Limits for tasks assessed                      General Comments      Exercises General Exercises - Lower Extremity Ankle Circles/Pumps: AROM;Both;20 reps;Supine      Assessment/Plan    PT Assessment Patient needs continued PT services  PT Diagnosis Difficulty walking;Abnormality of gait   PT Problem List Decreased balance;Decreased mobility;Decreased knowledge of use of DME;Decreased knowledge of precautions;Decreased range of motion  PT Treatment Interventions DME instruction;Gait training;Functional mobility training;Therapeutic activities;Therapeutic exercise;Patient/family education   PT Goals (Current goals can be found in the Care Plan section) Acute Rehab 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 PT Goal Formulation: With patient Time For Goal  Achievement: 02/18/14 Potential to Achieve Goals: Good    Frequency Min 5X/week   Barriers to discharge Decreased caregiver support wife supportive but can provide only min A safely    Co-evaluation               End of Session Equipment Utilized During Treatment: Gait belt;Right knee immobilizer;Left knee immobilizer Activity Tolerance: Patient tolerated treatment well Patient left: in bed;with call bell/phone within reach;with bed alarm set Nurse Communication: Mobility status    Functional Assessment Tool Used: clinical judgement Functional Limitation: Mobility: Walking and moving around Mobility: Walking and Moving Around Current Status (W0981): At least 60 percent but less than 80 percent impaired, limited or restricted Mobility: Walking and Moving Around Goal Status [phone]): At least 20 percent but less than 40 percent impaired, limited or restricted    Time: 0829-0912 PT Time Calculation (min): 43 min   Charges:   PT Evaluation $Initial PT Evaluation Tier I: 1 Procedure PT Treatments $Gait Training: 8-22 mins $Therapeutic Activity: 8-22 mins   PT G Codes:   Functional Assessment Tool Used: clinical judgement Functional Limitation: Mobility: Walking and moving  around   [facility], [place]  [phone]  Leighton Roach 02/11/2014, 10:15 AM

## 2014-02-11 NOTE — Progress Notes (Signed)
ANTICOAGULATION CONSULT NOTE - Follow Up Consult  Pharmacy Consult for coumadin Indication: VTE prophylaxis  Allergies  Allergen Reactions  . Enalapril Rash  . Neosporin [Neomycin-Bacitracin Zn-Polymyx] Rash    Patient Measurements: Height: 6\' 3"  (190.5 cm) Weight: 319 lb (144.697 kg) IBW/kg (Calculated) : 84.5   Vital Signs: Temp: 98.8 F (37.1 C) (06/20 0621) Temp src: Oral (06/20 0621) BP: 116/56 mmHg (06/20 0621) Pulse Rate: 89 (06/20 0621)  Labs:  Recent Labs  02/10/14 1112 02/11/14 0510  HGB 14.6  --   HCT 43.0  --   LABPROT  --  13.9  INR  --  1.09  CREATININE 0.70  --     Estimated Creatinine Clearance: 147.1 ml/min (by C-G formula based on Cr of 0.7).  Assessment: Patient is a 63 y.o M on coumadin for VTE s/p tendon repair.  INR is 1.09 after first dose of coumadin given last night.  No bleeding noted.  Goal of Therapy:  INR 1.5-2    Plan:  1) repeat coumadin 7.5mg  PO x1 today   Scarlette Hogston P 02/11/2014,10:55 AM

## 2014-02-12 LAB — GLUCOSE, CAPILLARY
Glucose-Capillary: 141 mg/dL — ABNORMAL HIGH (ref 70–99)
Glucose-Capillary: 165 mg/dL — ABNORMAL HIGH (ref 70–99)
Glucose-Capillary: 139 mg/dL — ABNORMAL HIGH (ref 70–99)
Glucose-Capillary: 128 mg/dL — ABNORMAL HIGH (ref 70–99)

## 2014-02-12 LAB — PROTIME-INR
INR: 1.12 (ref 0.00–1.49)
Prothrombin Time: 14.2 s (ref 11.6–15.2)

## 2014-02-12 MED ORDER — WARFARIN SODIUM 7.5 MG PO TABS
7.5000 mg | ORAL_TABLET | Freq: Once | ORAL | Status: AC
  Administered 2014-02-12: 7.5 mg via ORAL
  Filled 2014-02-12: qty 1

## 2014-02-12 NOTE — Progress Notes (Signed)
Utilization review completed.  

## 2014-02-12 NOTE — Progress Notes (Signed)
Physical Therapy Treatment Patient Details Name: GROVE DEFINA MRN: [id number] DOB: 27-Nov-1950 Today's Date: 02/12/2014    History of Present Illness Pt with PMH of DM, prostate cancer, remote back surgery. Fell down stairs at home sustaining bilateral quad rupture, underwent tendon repair 02/10/14.    PT Comments    Patient ambulated today and OOB to chair with assist. Current POC appropriate, will continue to see and progress as tolerated.  Follow Up Recommendations  CIR;Supervision for mobility/OOB (pt would like to go to rehab at [facility])     Equipment Recommendations  Rolling walker with 5" wheels;Wheelchair (measurements PT);Hospital bed (bariatric wide RW, lift chair)    Recommendations for Other Services Rehab consult     Precautions / Restrictions Precautions Precautions: Fall Required Braces or Orthoses: Knee Immobilizer - Right;Knee Immobilizer - Left Knee Immobilizer - Right: On at all times Knee Immobilizer - Left: On at all times Restrictions Weight Bearing Restrictions: Yes RLE Weight Bearing: Weight bearing as tolerated LLE Weight Bearing: Weight bearing as tolerated    Mobility  Bed Mobility Overal bed mobility: Needs Assistance;+2 for physical assistance Bed Mobility: Sit to Supine;Supine to Sit     Supine to sit: +2 for physical assistance;Mod assist     General bed mobility comments: Assisted patient with BLEs moving towards EOB, patient then used overhead frame to shift hips toward EOB, BLEs held in extension and controlled and lowered to the ground with bed elevated.  Transfers Overall transfer level: Needs assistance Equipment used: Rolling walker (2 wheeled) Transfers: Sit to/from Stand Sit to Stand: +2 physical assistance;Mod assist         General transfer comment: elevated bed so that patient could walk legs back underneath of him, bilateral foot blocks to prevent feet from sliding forward as patient attempts to walk legs up  underneath., to assist to chair, patient was cued for hand placement, and performed controlled fall back to chair  Ambulation/Gait Ambulation/Gait assistance: +2 safety/equipment;Min assist Ambulation Distance (Feet): 34 Feet Assistive device: Rolling walker (2 wheeled) Gait Pattern/deviations: Step-through pattern;Decreased stride length;Wide base of support Gait velocity: decreased   General Gait Details: as noted from previous session... pt was able to ambulate with circumduction pattern and did very well with this. He would benefit from a bariatric RW for his wt but also to give him a wider area to swing each leg out since he cannot flex knees.   Stairs            Wheelchair Mobility    Modified Rankin (Stroke Patients Only)       Balance     Sitting balance-Leahy Scale: Normal       Standing balance-Leahy Scale: Poor                      Cognition Arousal/Alertness: Awake/alert Behavior During Therapy: WFL for tasks assessed/performed Overall Cognitive Status: Within Functional Limits for tasks assessed                      Exercises General Exercises - Lower Extremity Ankle Circles/Pumps: AROM;Both;20 reps;Supine    General Comments        Pertinent Vitals/Pain 4/10    Home Living Family/patient expects to be discharged to:: Inpatient rehab Living Arrangements: Spouse/significant other                  Prior Function            PT Goals (current goals  can now be found in the care plan section) Acute Rehab 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 PT Goal Formulation: With patient Time For Goal Achievement: 02/18/14 Potential to Achieve Goals: Good Progress towards PT goals: Progressing toward goals    Frequency  Min 5X/week    PT Plan Current plan remains appropriate    Co-evaluation             End of Session Equipment Utilized During Treatment: Gait belt;Right knee immobilizer;Left knee  immobilizer Activity Tolerance: Patient tolerated treatment well Patient left: in chair;with call bell/phone within reach     Time: 1117-1145 PT Time Calculation (min): 28 min  Charges:  $Gait Training: 8-22 mins $Therapeutic Activity: 8-22 mins                    G CodesDuncan Dull February 26, 2014, 12:08 PM Alben Deeds, [place] DPT  [phone]

## 2014-02-12 NOTE — Progress Notes (Signed)
ANTICOAGULATION CONSULT NOTE - Follow Up Consult  Pharmacy Consult for coumadin Indication: VTE prophylaxis  Allergies  Allergen Reactions  . Enalapril Rash  . Neosporin [Neomycin-Bacitracin Zn-Polymyx] Rash    Patient Measurements: Height: 6\' 3"  (190.5 cm) Weight: 319 lb (144.697 kg) IBW/kg (Calculated) : 84.5   Vital Signs: Temp: 98.7 F (37.1 C) (06/21 0539) Temp src: Oral (06/21 0539) BP: 128/65 mmHg (06/21 0539) Pulse Rate: 82 (06/21 0539)  Labs:  Recent Labs  02/10/14 1112 02/11/14 0510 02/12/14 0353  HGB 14.6  --   --   HCT 43.0  --   --   LABPROT  --  13.9 14.2  INR  --  1.09 1.12  CREATININE 0.70  --   --     Estimated Creatinine Clearance: 147.1 ml/min (by C-G formula based on Cr of 0.7).  Assessment: Patient is a 63 y.o M on coumadin for VTE prophylaxis.  INR is sub-therapeutic at 1.12 today.  No bleeding documented  Goal of Therapy:  INR 1.5-2    Plan:  1) coumadin 7.5mg  PO x1 today   Mickey Esguerra P 02/12/2014,11:20 AM

## 2014-02-12 NOTE — Clinical Social Work Placement (Signed)
Clinical Social Work Department CLINICAL SOCIAL WORK PLACEMENT NOTE 02/12/2014  Patient:  William Guerra, William Guerra  Account Number:  [id number] Admit date:  02/10/2014  Clinical Social Worker:  Daiva Huge  Date/time:  02/12/2014 04:32 PM  Clinical Social Work is seeking post-discharge placement for this patient at the following level of care:   SKILLED NURSING   (*CSW will update this form in Epic as items are completed)   02/12/2014  Patient/family provided with [place] Department of Clinical Social Work's list of facilities offering this level of care within the geographic area requested by the patient (or if unable, by the patient's family).  02/12/2014  Patient/family informed of their freedom to choose among providers that offer the needed level of care, that participate in Medicare, Medicaid or managed care program needed by the patient, have an available bed and are willing to accept the patient.  02/12/2014  Patient/family informed of [facility]' ownership interest in [facility], as well as of the fact that they are under no obligation to receive care at this facility.  PASARR submitted to EDS on 02/12/2014 PASARR number received on 02/12/2014  FL2 transmitted to all facilities in geographic area requested by pt/family on  02/12/2014 FL2 transmitted to all facilities within larger geographic area on   Patient informed that his/her managed care company has contracts with or will negotiate with  certain facilities, including the following:     Patient/family informed of bed offers received:   Patient chooses bed at  Physician recommends and patient chooses bed at    Patient to be transferred to  on   Patient to be transferred to facility by  Patient and family notified of transfer on  Name of family member notified:    The following physician request were entered in Epic:   Additional Comments: Eduard Clos, MSW, [place] [phone]/weekend  coverage
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
## 2014-02-12 NOTE — Progress Notes (Signed)
PATIENT ID: William Guerra  MRN: [id number]  DOB/AGE:  1951/04/02 / 63 y.o.  2 Days Post-Op Procedure(s) (LRB): REPAIR QUADRICEP TENDON (Bilateral)    PROGRESS NOTE Subjective:   Patient is alert, oriented, no Nausea, no Vomiting, yes passing gas, no Bowel Movement. Taking PO well. Denies SOB, Chest or Calf Pain. Using Incentive Spirometer, PAS in place. Ambulate WBAT with knee immobilizers, Patient reports pain as 5 on 0-10 scale.  Pt states that he had a lot of difficulty with therapy yesterday and his discharge plan has changed from home to SNF.    Objective: Vital signs in last 24 hours: Temp:  [98.3 F (36.8 C)-98.8 F (37.1 C)] 98.7 F (37.1 C) (06/21 0539) Pulse Rate:  [82-86] 82 (06/21 0539) Resp:  [18] 18 (06/21 0539) BP: (112-128)/(58-65) 128/65 mmHg (06/21 0539) SpO2:  [92 %-99 %] 99 % (06/21 0539)    Intake/Output from previous day: I/O last 3 completed shifts: In: 2328.7 [P.O.:1680; I.V.:598.7; IV Piggyback:50] Out: 2300 [Urine:2300]   Intake/Output this shift:     LABORATORY DATA:  Recent Labs  02/10/14 1112  02/11/14 0510  02/11/14 1601 02/11/14 2144 02/12/14 0353 02/12/14 0652  HGB 14.6  --   --   --   --   --   --   --   HCT 43.0  --   --   --   --   --   --   --   NA 140  --   --   --   --   --   --   --   K 4.0  --   --   --   --   --   --   --   CL 100  --   --   --   --   --   --   --   BUN 18  --   --   --   --   --   --   --   CREATININE 0.70  --   --   --   --   --   --   --   GLUCOSE 182*  --   --   --   --   --   --   --   GLUCAP  --   < >  --   < > 141* 148*  --  141*  INR  --   --  1.09  --   --   --  1.12  --   < > = values in this interval not displayed.  Examination: Neurologically intact Neurovascular intact Sensation intact distally Intact pulses distally Dorsiflexion/Plantar flexion intact Incision: dressing C/D/I No cellulitis present Compartment soft}  Assessment:   2 Days Post-Op Procedure(s) (LRB): REPAIR QUADRICEP  TENDON (Bilateral) ADDITIONAL DIAGNOSIS:   Plan:  Weight Bearing as Tolerated (WBAT) with walker and knee immobilizers  DVT Prophylaxis:  Coumadin x 1 month  DISCHARGE PLAN: Skilled Nursing Facility/Rehab, pt would like [facility] if possible.  Pt had a setback with therapy and they are recommending SNF placement.  DISCHARGE NEEDS: HHPT, HHRN, Walker and 3-in-1 comode seat     PHILLIPS, ERIC R 02/12/2014, 8:23 AM

## 2014-02-13 LAB — GLUCOSE, CAPILLARY
GLUCOSE-CAPILLARY: 139 mg/dL — AB (ref 70–99)
GLUCOSE-CAPILLARY: 159 mg/dL — AB (ref 70–99)
GLUCOSE-CAPILLARY: 107 mg/dL — AB (ref 70–99)
Glucose-Capillary: 124 mg/dL — ABNORMAL HIGH (ref 70–99)

## 2014-02-13 LAB — PROTIME-INR
INR: 1.52 — ABNORMAL HIGH (ref 0.00–1.49)
Prothrombin Time: 17.9 seconds — ABNORMAL HIGH (ref 11.6–15.2)

## 2014-02-13 MED ORDER — POLYETHYLENE GLYCOL 3350 17 G PO PACK: 17.0000 g | PACK | Freq: Every day | ORAL | Status: AC | PRN

## 2014-02-13 MED ORDER — POLYETHYLENE GLYCOL 3350 17 G PO PACK
17.0000 g | PACK | Freq: Every day | ORAL | Status: DC | PRN
  Administered 2014-02-13: 17 g via ORAL
  Filled 2014-02-13: qty 1

## 2014-02-13 MED ORDER — WARFARIN SODIUM 5 MG PO TABS
5.0000 mg | ORAL_TABLET | Freq: Once | ORAL | Status: AC
  Administered 2014-02-13: 5 mg via ORAL
  Filled 2014-02-13: qty 1

## 2014-02-13 MED ORDER — DSS 100 MG PO CAPS: 100.0000 mg | ORAL_CAPSULE | Freq: Two times a day (BID) | ORAL | Status: AC

## 2014-02-13 MED ORDER — DOCUSATE SODIUM 100 MG PO CAPS
100.0000 mg | ORAL_CAPSULE | Freq: Two times a day (BID) | ORAL | Status: DC
  Administered 2014-02-13 – 2014-02-15 (×5): 100 mg via ORAL
  Filled 2014-02-13 (×6): qty 1

## 2014-02-13 NOTE — Progress Notes (Signed)
Physical Therapy Treatment Patient Details Name: William Guerra MRN: [id number] DOB: 10/17/1950 Today's Date: 02/13/2014    History of Present Illness Pt with PMH of DM, prostate cancer, remote back surgery. Fell down stairs at home sustaining bilateral quad rupture, underwent tendon repair 02/10/14.    PT Comments    Pt very motivated and able to increase Amb distance today.  Pt continues to require the most A for transfers due to maintaining Bil LE extension in bil KIs.  Will continue to follow.    Follow Up Recommendations  CIR     Equipment Recommendations  Rolling walker with 5" wheels;Wheelchair (measurements PT);Hospital bed    Recommendations for Other Services       Precautions / Restrictions Precautions Precautions: Fall Required Braces or Orthoses: Knee Immobilizer - Right;Knee Immobilizer - Left Knee Immobilizer - Right: On at all times Knee Immobilizer - Left: On at all times Restrictions Weight Bearing Restrictions: Yes RLE Weight Bearing: Weight bearing as tolerated LLE Weight Bearing: Weight bearing as tolerated    Mobility  Bed Mobility Overal bed mobility: Needs Assistance Bed Mobility: Supine to Sit     Supine to sit: Mod assist;HOB elevated     General bed mobility comments: pt utilizes HOB elevated and bed rails to complete mobility with one person A.    Transfers Overall transfer level: Needs assistance Equipment used: Rolling walker (2 wheeled) Transfers: Sit to/from Stand Sit to Stand: Min assist;+2 physical assistance         General transfer comment: Ht of bed elevated to A with coming to stand in [place].  pt demos good technique utilizing UEs and then walking feet back underneath himself.    Ambulation/Gait Ambulation/Gait assistance: Min assist Ambulation Distance (Feet): 80 Feet Assistive device: Rolling walker (2 wheeled) Gait Pattern/deviations: Step-through pattern;Decreased stride length;Wide base of support     General  Gait Details: pt has to use hip hike and circunmduction to advance each LE.  pt is able to improve overall distance with 3 standing rest breaks.  pt with one LOB posteriorly requiring only MinA to correct.     Stairs            Wheelchair Mobility    Modified Rankin (Stroke Patients Only)       Balance                                    Cognition Arousal/Alertness: Awake/alert Behavior During Therapy: WFL for tasks assessed/performed Overall Cognitive Status: Within Functional Limits for tasks assessed                      Exercises      General Comments        Pertinent Vitals/Pain Pt denied pain today.      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 Time For Goal Achievement: 02/18/14 Potential to Achieve Goals: Good Progress towards PT goals: Progressing toward goals    Frequency  Min 5X/week    PT Plan Current plan remains appropriate    Co-evaluation  End of Session Equipment Utilized During Treatment: Gait belt;Right knee immobilizer;Left knee immobilizer Activity Tolerance: Patient tolerated treatment well Patient left: in chair;with call bell/phone within reach     Time: 0840-0908 PT Time Calculation (min): 28 min  Charges:  $Gait Training: 8-22 mins $Therapeutic Activity: 8-22 mins                    G CodesCatarina Hartshorn, [place] 02/13/2014, 2:52 PM

## 2014-02-13 NOTE — Progress Notes (Signed)
Utilization review completed.  

## 2014-02-13 NOTE — Discharge Summary (Addendum)
Patient ID: William Guerra MRN: [id number] DOB/AGE: [birth year] 63 y.o.  Admit date: 02/10/2014 Discharge date: 02/15/2014 Admission Diagnoses:  Principal Problem:   Traumatic rupture of left quadriceps tendon Active Problems:   Rupture of right quadriceps tendon   Quadriceps tendon rupture   Discharge Diagnoses:  Same  Past Medical History  Diagnosis Date  . Hypertension   . Hypertriglyceridemia   . Type II diabetes mellitus   . Prostate cancer ~ 2005  . Anginal pain     "couple times recently" (05/18/2013). 02/09/14- no pain since 9/14  . Headache(784.0)   . Arthritis     knee    Surgeries: Procedure(s):bilateral REPAIR QUADRICEP TENDON on 02/10/2014    Discharged Condition: Improved  Hospital Course: William Guerra is an 63 y.o. male who was admitted 02/10/2014 for operative treatment ofTraumatic rupture of left quadriceps tendon.and right Patient has severe unremitting pain that affects sleep, daily activities, and work/hobbies. After pre-op clearance the patient was taken to the operating room on 02/10/2014 and underwent  Procedure(s):bilateral. REPAIR QUADRICEP TENDON.    Patient was given perioperative antibiotics: Anti-infectives   Start     Dose/Rate Route Frequency Ordered Stop   02/10/14 1800  ceFAZolin (ANCEF) IVPB 2 g/50 mL premix     2 g 100 mL/hr over 30 Minutes Intravenous Every 6 hours 02/10/14 1520 02/11/14 0657   02/10/14 1100  clindamycin (CLEOCIN) IVPB 900 mg     900 mg 100 mL/hr over 30 Minutes Intravenous On call to O.R. 02/10/14 1047 02/10/14 1235       Patient was given sequential compression devices, early ambulation, and chemoprophylaxis to prevent DVT.  Patient benefited maximally from hospital stay and there were no complications.  The patient made very slow progress with physical therapy due to his size and bilateral lower extremity injuries. Initially he was suggested by physical therapy and occupational therapy to go to a skilled nursing  facility, but unfortunately insurance would not cover this apparently. He will be discharged to home with home health physical therapy.  Recent vital signs: Patient Vitals for the past 24 hrs:  BP Temp Temp src Pulse Resp SpO2  02/13/14 0631 127/66 mmHg 98.9 F (37.2 C) Oral 80 18 92 %  02/12/14 1959 113/61 mmHg 98.3 F (36.8 C) Oral 82 18 92 %  02/12/14 1329 113/63 mmHg 97.9 F (36.6 C) Oral 79 18 95 %     Recent laboratory studies:  Recent Labs  02/10/14 1112  02/12/14 0353 02/13/14 0438  HGB 14.6  --   --   --   HCT 43.0  --   --   --   NA 140  --   --   --   K 4.0  --   --   --   CL 100  --   --   --   BUN 18  --   --   --   CREATININE 0.70  --   --   --   GLUCOSE 182*  --   --   --   INR  --   < > 1.12 1.52*  < > = values in this interval not displayed.   Discharge Medications:     Medication List    STOP taking these medications       aspirin 81 MG tablet      TAKE these medications       Acidophilus Caps capsule  Take 1 capsule by mouth daily.  atenolol 25 MG tablet  Commonly known as:  TENORMIN  Take 25 mg by mouth daily.     CINNAMON PO  Take 2 tablets by mouth 2 (two) times daily.     DSS 100 MG Caps  Take 100 mg by mouth 2 (two) times daily.     FISH OIL PO  Take 2 capsules by mouth 2 (two) times daily.     metFORMIN 500 MG tablet  Commonly known as:  GLUCOPHAGE  Take 500 mg by mouth 2 (two) times daily with a meal.     methocarbamol 750 MG tablet  Commonly known as:  ROBAXIN-750  Take 1 tablet (750 mg total) by mouth every 8 (eight) hours as needed for muscle spasms.     multivitamin with minerals tablet  Take 1 tablet by mouth daily.     oxyCODONE-acetaminophen 5-325 MG per tablet  Commonly known as:  PERCOCET  Take 1-2 tablets by mouth every 4 (four) hours as needed for severe pain.     oxyCODONE-acetaminophen 5-325 MG per tablet  Commonly known as:  PERCOCET/ROXICET  Take 1-2 tablets by mouth every 6 (six) hours as needed for  severe pain.     polyethylene glycol packet  Commonly known as:  MIRALAX / GLYCOLAX  Take 17 g by mouth daily as needed for moderate constipation.     simvastatin 40 MG tablet  Commonly known as:  ZOCOR  Take 40 mg by mouth daily.     warfarin 5 MG tablet  Commonly known as:  COUMADIN  Take 1 tablet (5 mg total) by mouth daily. Unless otherwise directed. Take x 1 month post op.        Diagnostic Studies: Dg Knee Complete 4 Views Left  02/05/2014   CLINICAL DATA:  Left knee pain  EXAM: LEFT KNEE - COMPLETE 4+ VIEW  COMPARISON:  None  FINDINGS: Moderate joint effusion identified. No fracture or dislocation identified. There is no evidence of arthropathy or other focal bone abnormality. Soft tissues are unremarkable.  IMPRESSION: 1. No acute bone abnormality. 2. Joint effusion.   Electronically Signed   By: Kerby Moors M.D.   On: 02/05/2014 12:24   Dg Knee Complete 4 Views Right  02/05/2014   CLINICAL DATA:  Pain.  EXAM: RIGHT KNEE - COMPLETE 4+ VIEW  COMPARISON:  None.  FINDINGS: No acute bony or joint abnormality. Diffuse degenerative change. No evidence of fracture or dislocation.  IMPRESSION: DJD.  No acute abnormality.   Electronically Signed   By: Marcello Moores  Register   On: 02/05/2014 12:23    Disposition: Home with home health PT.      Discharge Instructions   Call MD / Call 911    Complete by:  As directed   If you experience chest pain or shortness of breath, CALL 911 and be transported to the hospital emergency room.  If you develope a fever above 101 F, pus (white drainage) or increased drainage or redness at the wound, or calf pain, call your surgeon's office.     Call MD / Call 911    Complete by:  As directed   If you experience chest pain or shortness of breath, CALL 911 and be transported to the hospital emergency room.  If you develope a fever above 101 F, pus (white drainage) or increased drainage or redness at the wound, or calf pain, call your surgeon's office.      Change dressing    Complete by:  As directed  Change dressing on 5, then change the dressing daily with sterile 4 x 4 inch gauze dressing and apply TED hose.  You may clean the incision with alcohol prior to redressing.     Constipation Prevention    Complete by:  As directed   Drink plenty of fluids.  Prune juice may be helpful.  You may use a stool softener, such as Colace (over the counter) 100 mg twice a day.  Use MiraLax (over the counter) for constipation as needed.     Constipation Prevention    Complete by:  As directed   Drink plenty of fluids.  Prune juice may be helpful.  You may use a stool softener, such as Colace (over the counter) 100 mg twice a day.  Use MiraLax (over the counter) for constipation as needed.     Diet - low sodium heart healthy    Complete by:  As directed      Diet Carb Modified    Complete by:  As directed      Discharge instructions    Complete by:  As directed   Follow up with Dr. Berenice Primas in office in 10-14 days     Driving restrictions    Complete by:  As directed   No driving for 2 weeks     Increase activity slowly as tolerated    Complete by:  As directed      Increase activity slowly as tolerated    Complete by:  As directed      Patient may shower    Complete by:  As directed   You may shower without a dressing once there is no drainage.  Do not wash over the wound.  If drainage remains, cover wound with plastic wrap and then shower.     Weight bearing as tolerated    Complete by:  As directed   Laterality:  bilateral  Extremity:  Lower     Weight bearing as tolerated    Complete by:  As directed   Laterality:  bilateral  Extremity:  Lower  With knee immobilizers in place Wear knee immobilizers at all times.         the patient will need a pro time and INR every 3-4 days per pharmacy protocol shooting for INR of 1.5-2.0.  Use of Coumadin x1 month postop for DVT prophylaxis.  Follow-up Information   Follow up with GRAVES,JOHN L, MD.  Schedule an appointment as soon as possible for a visit in 2 weeks.   Specialty:  Orthopedic Surgery   Contact information:   [place] [address] [phone]        Signed: Erlene Senters 02/13/2014, 10:13 AM

## 2014-02-13 NOTE — Clinical Social Work Note (Addendum)
Updated patient on SNF search- I have faxed clinicals to are SNF's including 3 counties and will advise patient of offers and proceed with SNF auth (BCBS).  Will likely need OT eval as well for [place] has requested this via RNCM.   Eduard Clos, MSW, [place]

## 2014-02-13 NOTE — Progress Notes (Signed)
Rehab Admissions Coordinator Note:  Patient was screened by Cleatrice Burke for appropriateness for an Inpatient Acute Rehab Consult  At this time, pt requesting [facility] rehab per PT and noted also requesting [facility] per notes. . If pt would like to pursue inpt rehab admission here at [facility], please order an IP rehab consult as well as OT eval which would be needed for [facility] approval. [facility] rehab is SNF level rehab. Please call me for any questions.   Cleatrice Burke 02/13/2014, 8:51 AM  I can be reached at [phone].

## 2014-02-13 NOTE — Evaluation (Signed)
Occupational Therapy Evaluation Patient Details Name: William Guerra MRN: [id number] DOB: 09-Dec-1950 Today's Date: 02/13/2014    History of Present Illness Pt with PMH of DM, prostate cancer, remote back surgery. Fell down stairs at home sustaining bilateral quad rupture, underwent tendon repair 02/10/14.   Clinical Impression   This 63 yo male admitted and underwent above presents to acute OT with decreased allowed AROM in Bil LEs both due to surgery and Bil knee immobilizers that pt now has to wear anytime he is moving, decreased overall strength, decreased balance all affecting pt's ability to care for himself or his wife to A him at home at his current level due to their differences in size. Pt will benefit from acute OT with follow up OT at SNF to get to a Mod I level to go home.    Follow Up Recommendations  SNF    Equipment Recommendations  None recommended by OT       Precautions / Restrictions Precautions Precautions: Fall Required Braces or Orthoses: Knee Immobilizer - Right;Knee Immobilizer - Left Knee Immobilizer - Right: On at all times Knee Immobilizer - Left: On at all times Restrictions Weight Bearing Restrictions: Yes RLE Weight Bearing: Weight bearing as tolerated (with KI on) LLE Weight Bearing: Weight bearing as tolerated (with KI on)      Mobility Bed Mobility Overal bed mobility: Needs Assistance Bed Mobility: Supine to Sit;Sit to Supine     Supine to sit: HOB elevated;Mod assist Sit to supine: Mod assist   General bed mobility comments: Introduced leg lifter that will potentially increased his independence with getting in an OOB  Transfers Overall transfer level: Needs assistance Equipment used: Rolling walker (2 wheeled) Transfers: Sit to/from Stand Sit to Stand: Min assist;+2 physical assistance         General transfer comment: Height of bed raised extremely high to allow for greater ease for sit <>stand    Balance Overall balance  assessment: Needs assistance Sitting-balance support: Feet supported;No upper extremity supported Sitting balance-Leahy Scale: Good     Standing balance support: Bilateral upper extremity supported Standing balance-Leahy Scale: Poor                              ADL Overall ADL's : Needs assistance/impaired Eating/Feeding: Independent;Sitting   Grooming: Set up;Sitting   Upper Body Bathing: Set up;Sitting   Lower Body Bathing: Maximal assistance;Sit to/from stand (with bed raised way up in the air to allow for ease of sit<>stand)   Upper Body Dressing : Set up;Sitting   Lower Body Dressing: Total assistance;Sit to/from stand (with bed raised way up in the air to allow for ease of sit<>stand)   Toilet Transfer: Minimal assistance;+2 for physical assistance;BSC;Ambulation (raised really tall due to BIL KI)   Toileting- Clothing Manipulation and Hygiene: Total assistance;Sit to/from stand                         Pertinent Vitals/Pain No c/o pain     Hand Dominance Right   Extremity/Trunk Assessment Upper Extremity Assessment Upper Extremity Assessment: Overall WFL for tasks assessed   Lower Extremity Assessment Lower Extremity Assessment: Defer to PT evaluation       Communication Communication Communication: No difficulties   Cognition Arousal/Alertness: Awake/alert Behavior During Therapy: WFL for tasks assessed/performed Overall Cognitive Status: Within Functional Limits for tasks assessed  Home Living Family/patient expects to be discharged to:: Inpatient rehab Living Arrangements: Spouse/significant other Available Help at Discharge: Family;Available 24 hours/day Type of Home: House Home Access: Ramped entrance     Home Layout: Two level Alternate Level Stairs-Number of Steps: 14             Home Equipment: Bedside commode   Additional Comments: wife in a retired Marine scientist and can give him min  A but he is 6'3", 319 lbs so will have to be nearly independent to go home      Prior Functioning/Environment Level of Independence: Independent        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    OT Diagnosis: Generalized weakness   OT Problem List: Decreased range of motion;Impaired balance (sitting and/or standing);Decreased knowledge of use of DME or AE   OT Treatment/Interventions: Self-care/ADL training;Patient/family education;Balance training;DME and/or AE instruction    OT Goals(Current goals can be found in the care plan section) Acute Rehab OT Goals Patient Stated Goal: home after rehab OT Goal Formulation: With patient Time For Goal Achievement: 02/20/14 Potential to Achieve Goals: Good  OT Frequency: Min 2X/week              End of Session Equipment Utilized During Treatment: Rolling walker;Right knee immobilizer;Left knee immobilizer  Activity Tolerance: Patient limited by fatigue Patient left: in bed;with call bell/phone within reach;with nursing/sitter in room (PA in room changing leg dressings)   Time: 3151-7616 OT Time Calculation (min): 34 min Charges:  OT General Charges $OT Visit: 1 Procedure OT Evaluation $Initial OT Evaluation Tier I: 1 Procedure OT Treatments $Self Care/Home Management : 23-37 mins G-Codes: OT G-codes **NOT FOR INPATIENT CLASS** Functional Assessment Tool Used: Clinical observation Functional Limitation: Self care Self Care Current Status (W7371): At least 80 percent but less than 100 percent impaired, limited or restricted Self Care Goal Status (G6269): At least 60 percent but less than 80 percent impaired, limited or restricted  Almon Register [phone] 02/13/2014, 4:14 PM

## 2014-02-13 NOTE — Progress Notes (Signed)
Subjective: 3 Days Post-Op Procedure(s) (LRB): REPAIR QUADRICEP TENDON (Bilateral) Patient reports pain as 5 on 0-10 scale. Slow progress with physical therapy.  They have recommended SNF.  Complains of pain in both knees when he attempts to mobilize.  He lives with his wife who is unable to lift him and significantly help him with mobilization.  She weighs just over 100 pounds.  He weighs 327 pounds.  He is concerned about getting constipated.  He would like a stool softener ordered.  Taking by mouth/voiding okay.   Objective: Vital signs in last 24 hours: Temp:  [97.9 F (36.6 C)-98.9 F (37.2 C)] 98.9 F (37.2 C) (06/22 0631) Pulse Rate:  [79-82] 80 (06/22 0631) Resp:  [18] 18 (06/22 0631) BP: (113-127)/(61-66) 127/66 mmHg (06/22 0631) SpO2:  [92 %-95 %] 92 % (06/22 0631)  Intake/Output from previous day: 06/21 0701 - 06/22 0700 In: 600 [P.O.:600] Out: 1300 [Urine:1300] Intake/Output this shift:     Recent Labs  02/10/14 1112  HGB 14.6    Recent Labs  02/10/14 1112  HCT 43.0    Recent Labs  02/10/14 1112  NA 140  K 4.0  CL 100  BUN 18  CREATININE 0.70  GLUCOSE 182*    Recent Labs  02/12/14 0353 02/13/14 0438  INR 1.12 1.52*  bilateral lower extremities:  Knee immobilizer is intact bilaterally..  Calves are soft.  Nontender.  Moves feet/toes actively.  Normal sensation distally.   Assessment/Plan: 3 Days Post-Op Procedure(s) (LRB): REPAIR QUADRICEP TENDON (Bilateral) Plan: Knee immobilizers to both knees.  May weight-bear as tolerated bilaterally. Up with therapy Discharge to SNF today when arrangements made. Continue on oral Coumadin shooting for INR of 1.5-2.0 for DVT prophylaxis. Followup with Dr. Berenice Primas in 2 weeks.  Oral Remache G 02/13/2014, 9:06 AM

## 2014-02-13 NOTE — Progress Notes (Signed)
ANTICOAGULATION CONSULT NOTE - Follow Up Consult  Pharmacy Consult for coumadin Indication: VTE prophylaxis  Allergies  Allergen Reactions  . Enalapril Rash  . Neosporin [Neomycin-Bacitracin Zn-Polymyx] Rash    Patient Measurements: Height: 6\' 3"  (190.5 cm) Weight: 319 lb (144.697 kg) IBW/kg (Calculated) : 84.5   Vital Signs: Temp: 98.5 F (36.9 C) (06/22 1302) Temp src: Oral (06/22 0631) BP: 139/88 mmHg (06/22 1302) Pulse Rate: 88 (06/22 1302)  Labs:  Recent Labs  02/11/14 0510 02/12/14 0353 02/13/14 0438  LABPROT 13.9 14.2 17.9*  INR 1.09 1.12 1.52*    Estimated Creatinine Clearance: 147.1 ml/min (by C-G formula based on Cr of 0.7).  Assessment: Patient is a 63 y.o M on coumadin for VTE prophylaxis.  INR is sub-therapeutic at 1.5 today but trending up nicely now.  No bleeding documented. Likely d/c to snf soon.  Goal of Therapy:  INR 1.5-2    Plan:  1) coumadin 5mg  PO x1 today   Georgina Peer 02/13/2014,5:39 PM

## 2014-02-14 ENCOUNTER — Encounter (HOSPITAL_COMMUNITY): Payer: Self-pay | Admitting: Orthopedic Surgery

## 2014-02-14 LAB — PROTIME-INR
INR: 1.62 — ABNORMAL HIGH (ref 0.00–1.49)
Prothrombin Time: 18.8 seconds — ABNORMAL HIGH (ref 11.6–15.2)

## 2014-02-14 LAB — GLUCOSE, CAPILLARY
GLUCOSE-CAPILLARY: 139 mg/dL — AB (ref 70–99)
Glucose-Capillary: 153 mg/dL — ABNORMAL HIGH (ref 70–99)
Glucose-Capillary: 125 mg/dL — ABNORMAL HIGH (ref 70–99)
Glucose-Capillary: 140 mg/dL — ABNORMAL HIGH (ref 70–99)

## 2014-02-14 MED ORDER — WARFARIN SODIUM 7.5 MG PO TABS
7.5000 mg | ORAL_TABLET | Freq: Once | ORAL | Status: DC
  Filled 2014-02-14: qty 1

## 2014-02-14 MED ORDER — WARFARIN SODIUM 5 MG PO TABS
5.0000 mg | ORAL_TABLET | Freq: Once | ORAL | Status: AC
  Administered 2014-02-14: 5 mg via ORAL
  Filled 2014-02-14: qty 1

## 2014-02-14 NOTE — Progress Notes (Signed)
Utilization review completed.  

## 2014-02-14 NOTE — Clinical Social Work Note (Signed)
William Guerra is attempting to get William Guerra but per their report BCBS needs clinicals for hospital stay- CSW advised RNCM of this- hopeful for auth soon- Eduard Clos, MSW, [place]

## 2014-02-14 NOTE — Progress Notes (Signed)
Subjective: 4 Days Post-Op Procedure(s) (LRB): REPAIR QUADRICEP TENDON (Bilateral) Patient reports pain as mild.    Objective: Vital signs in last 24 hours: Temp:  [97.4 F (36.3 C)-98.5 F (36.9 C)] 97.4 F (36.3 C) (06/23 0525) Pulse Rate:  [76-88] 76 (06/23 0525) Resp:  [18] 18 (06/23 0525) BP: (127-139)/(65-88) 127/72 mmHg (06/23 0525) SpO2:  [95 %-98 %] 98 % (06/23 0525)  Intake/Output from previous day: 06/22 0701 - 06/23 0700 In: 580 [P.O.:580] Out: 1300 [Urine:1300] Intake/Output this shift:    No results found for this basename: HGB,  in the last 72 hours No results found for this basename: WBC, RBC, HCT, PLT,  in the last 72 hours No results found for this basename: NA, K, CL, CO2, BUN, CREATININE, GLUCOSE, CALCIUM,  in the last 72 hours  Recent Labs  02/13/14 0438 02/14/14 0451  INR 1.52* 1.62*    Neurologically intact ABD soft Neurovascular intact Sensation intact distally Intact pulses distally Dorsiflexion/Plantar flexion intact No cellulitis present Compartment soft  Assessment/Plan: 4 Days Post-Op Procedure(s) (LRB): REPAIR QUADRICEP TENDON (Bilateral) Advance diet Up with therapy Discharge to SNF  Niralya Ohanian L 02/14/2014, 7:53 AM

## 2014-02-14 NOTE — Progress Notes (Signed)
ANTICOAGULATION CONSULT NOTE - Follow Up Consult  Pharmacy Consult for Coumadin Indication: VTE prophylaxis  Allergies  Allergen Reactions  . Enalapril Rash  . Neosporin [Neomycin-Bacitracin Zn-Polymyx] Rash    Patient Measurements: Height: 6\' 3"  (190.5 cm) Weight: 319 lb (144.697 kg) IBW/kg (Calculated) : 84.5   Vital Signs: Temp: 97.4 F (36.3 C) (06/23 0525) BP: 127/72 mmHg (06/23 0525) Pulse Rate: 76 (06/23 0525)  Labs:  Recent Labs  02/12/14 0353 02/13/14 0438 02/14/14 0451  LABPROT 14.2 17.9* 18.8*  INR 1.12 1.52* 1.62*    Estimated Creatinine Clearance: 147.1 ml/min (by C-G formula based on Cr of 0.7).  Assessment: Patient is a 63 y.o M on Coumadin for VTE prophylaxis.  INR is therapeutic at 1.62 today.  No bleeding documented. Likely d/c to snf soon.  Goal of Therapy:  INR 1.5-2   Plan:  1) Coumadin 5 mg PO x1 today   [place], Pharm.D., BCPS Clinical Pharmacist Pager: [phone] 02/14/2014 2:20 PM

## 2014-02-14 NOTE — Progress Notes (Signed)
Physical Therapy Treatment Patient Details Name: William Guerra MRN: [id number] DOB: 1951-08-24 Today's Date: 02/14/2014    History of Present Illness Pt with PMH of DM, prostate cancer, remote back surgery. Fell down stairs at home sustaining bilateral quad rupture, underwent tendon repair 02/10/14.    PT Comments    Pt continues to be motivated to improve mobility, but limited by need for Bil KIs.  Pt continues to require 2nd person for transfers.  Will continue to follow.    Follow Up Recommendations  CIR     Equipment Recommendations  Rolling walker with 5" wheels;Wheelchair (measurements PT);Hospital bed    Recommendations for Other Services       Precautions / Restrictions Precautions Precautions: Fall Required Braces or Orthoses: Knee Immobilizer - Right;Knee Immobilizer - Left Knee Immobilizer - Right: On at all times Knee Immobilizer - Left: On at all times Restrictions Weight Bearing Restrictions: Yes RLE Weight Bearing: Weight bearing as tolerated LLE Weight Bearing: Weight bearing as tolerated    Mobility  Bed Mobility Overal bed mobility: Needs Assistance Bed Mobility: Supine to Sit     Supine to sit: Min assist;HOB elevated     General bed mobility comments: pt able to utilize leg lifter with HOB elevated and only required MinA to complete mobility coming to EOB.    Transfers Overall transfer level: Needs assistance Equipment used: Rolling walker (2 wheeled) Transfers: Sit to/from Stand Sit to Stand: Min assist;+2 physical assistance         General transfer comment: Height of bed elevated to A with coming to stand in [place].  pt again able to bring LEs back underneath himself with support of RW and 2 person A.    Ambulation/Gait Ambulation/Gait assistance: Min guard Ambulation Distance (Feet): 80 Feet Assistive device: Rolling walker (2 wheeled) Gait Pattern/deviations: Step-through pattern;Decreased stride length;Wide base of support;Trunk  flexed     General Gait Details: pt continues to use bil hip hike and circumduction to advance each LE.  pt required 2 standing rest breaks today.     Stairs            Wheelchair Mobility    Modified Rankin (Stroke Patients Only)       Balance Overall balance assessment: Needs assistance         Standing balance support: Single extremity supported Standing balance-Leahy Scale: Poor                      Cognition Arousal/Alertness: Awake/alert Behavior During Therapy: WFL for tasks assessed/performed Overall Cognitive Status: Within Functional Limits for tasks assessed                      Exercises      General Comments        Pertinent Vitals/Pain "Minimal"  Premedicated.      Home Living                      Prior Function            PT Goals (current goals can now be found in the care plan section) Acute Rehab PT Goals Time For Goal Achievement: 02/18/14 Potential to Achieve Goals: Good Progress towards PT goals: Progressing toward goals    Frequency  Min 5X/week    PT Plan Current plan remains appropriate    Co-evaluation             End of Session Equipment Utilized  During Treatment: Gait belt;Right knee immobilizer;Left knee immobilizer Activity Tolerance: Patient tolerated treatment well Patient left: in chair;with call bell/phone within reach     Time: 1153-1219 PT Time Calculation (min): 26 min  Charges:  $Gait Training: 8-22 mins $Therapeutic Activity: 8-22 mins                    G CodesCatarina Hartshorn, [place] 02/14/2014, 1:16 PM

## 2014-02-14 NOTE — Care Management Note (Signed)
CARE MANAGEMENT NOTE 02/14/2014  Patient:  William Guerra, William Guerra   Account Number:  [id number]  Date Initiated:  02/11/2014  Documentation initiated by:  [facility]  Subjective/Objective Assessment:   adm: Traumatic rupture of left quadriceps tendon; Open reduction and internal fixation of right quadriceps tendon      with #5 and #2 FiberWire suture with retinacular repair.  2. It is going to be left open quadriceps tendon repair with #5     Action/Plan:   discharge planning   Anticipated DC Date:  02/15/2014   Anticipated DC Plan:  [place]  CM consult      [place]   Choice offered to / List presented to:  C-1 Patient   DME arranged  [place]      DME agency  [place] arranged  [place]   Status of service:  Completed, signed off Medicare Important Message given?   (If response is "NO", the following Medicare IM given date [place] will be blank) Date Medicare IM given:   Date Additional Medicare IM given:    Discharge Disposition:  [place]  Per UR Regulation:    If discussed at Long Length of Stay Meetings, dates discussed:

## 2014-02-15 LAB — GLUCOSE, CAPILLARY
Glucose-Capillary: 135 mg/dL — ABNORMAL HIGH (ref 70–99)
Glucose-Capillary: 110 mg/dL — ABNORMAL HIGH (ref 70–99)

## 2014-02-15 LAB — PROTIME-INR
INR: 1.66 — ABNORMAL HIGH (ref 0.00–1.49)
Prothrombin Time: 19.1 seconds — ABNORMAL HIGH (ref 11.6–15.2)

## 2014-02-15 MED ORDER — WARFARIN SODIUM 5 MG PO TABS
5.0000 mg | ORAL_TABLET | Freq: Once | ORAL | Status: DC
  Filled 2014-02-15: qty 1

## 2014-02-15 NOTE — Progress Notes (Signed)
Patient discharged to home. Discharge instructions and rx given and explained and patient stated understanding. IV was removed and patient left unit in a stable condition with all personal belongings via EMS.

## 2014-02-15 NOTE — Progress Notes (Signed)
Subjective: 5 Days Post-Op Procedure(s) (LRB): REPAIR QUADRICEP TENDON (Bilateral) Patient reports pain as 2 on 0-10 scale.  Apparently insurance would not cover skilled nursing facility. Patient has been set up to be discharged home. He has been set up for a hospital bed and home health physical therapy. You will need Coumadin management shooting for an INR between 1.5 and 2.0. He will need protimes and INR done per pharmacy protocol.  Objective: Vital signs in last 24 hours: Temp:  [97.8 F (36.6 C)-98.6 F (37 C)] 97.9 F (36.6 C) (06/24 0637) Pulse Rate:  [84-85] 84 (06/24 0637) Resp:  [18] 18 (06/24 0637) BP: (111-122)/(63-66) 122/65 mmHg (06/24 0637) SpO2:  [96 %-97 %] 97 % (06/24 0637)  Intake/Output from previous day: 06/23 0701 - 06/24 0700 In: 1058.6 [P.O.:1058.6] Out: 2200 [Urine:2200] Intake/Output this shift:    No results found for this basename: HGB,  in the last 72 hours No results found for this basename: WBC, RBC, HCT, PLT,  in the last 72 hours No results found for this basename: NA, K, CL, CO2, BUN, CREATININE, GLUCOSE, CALCIUM,  in the last 72 hours  Recent Labs  02/14/14 0451 02/15/14 0519  INR 1.62* 1.66*   bilateral lower extremities: Knee immobilizer is intact to both lower extremities. Calves are soft bilaterally and the intact distally good ankle plantar and dorsiflexor strength.    Assessment/Plan: 5 Days Post-Op Procedure(s) (LRB): REPAIR QUADRICEP TENDON (Bilateral) High body mass index. Plan: Discharge home with home health Followup with Dr. Berenice Primas in 2 weeks. He will need home health PT 3 times a week, hospital bed, RN for protimes/INR and Coumadin management per pharmacy protocol until he is 1 month postop  BETHUNE,JAMES G 02/15/2014, 8:25 AM

## 2014-02-15 NOTE — Progress Notes (Signed)
ANTICOAGULATION CONSULT NOTE - Follow Up Consult  Pharmacy Consult for coumadin Indication: VTE prophylaxis  Allergies  Allergen Reactions  . Enalapril Rash  . Neosporin [Neomycin-Bacitracin Zn-Polymyx] Rash    Patient Measurements: Height: 6\' 3"  (190.5 cm) Weight: 319 lb (144.697 kg) IBW/kg (Calculated) : 84.5   Vital Signs: Temp: 97.9 F (36.6 C) (06/24 0637) Temp src: Oral (06/24 0637) BP: 122/65 mmHg (06/24 0637) Pulse Rate: 84 (06/24 0637)  Labs:  Recent Labs  02/13/14 0438 02/14/14 0451 02/15/14 0519  LABPROT 17.9* 18.8* 19.1*  INR 1.52* 1.62* 1.66*    Estimated Creatinine Clearance: 147.1 ml/min (by C-G formula based on Cr of 0.7).  Assessment: Patient is a 63 y.o M on coumadin for VTE prophylaxis with plan for possible d/c to SNF today.  INR is therapeutic at 1.66.  No bleeding documented.  Goal of Therapy:  INR 1.5-2   Plan:  1) coumadin 5mg  PO x1 today   William Guerra P 02/15/2014,10:41 AM

## 2014-02-22 NOTE — Addendum Note (Signed)
Addendum created 02/22/14 1605 by Albertha Ghee, MD   Modules edited: Anesthesia Attestations

## 2014-10-03 ENCOUNTER — Ambulatory Visit (HOSPITAL_COMMUNITY)
Admission: RE | Admit: 2014-10-03 | Discharge: 2014-10-03 | Disposition: A | Discharge status: Home / Self Care | Payer: BLUE CROSS/BLUE SHIELD | Source: Ambulatory Visit | Attending: Orthopedic Surgery | Admitting: Orthopedic Surgery

## 2014-10-03 ENCOUNTER — Other Ambulatory Visit (HOSPITAL_COMMUNITY): Payer: Self-pay | Admitting: Orthopedic Surgery

## 2014-10-03 DIAGNOSIS — M25562 Pain in left knee: Secondary | ICD-10-CM | POA: Diagnosis not present

## 2014-10-03 DIAGNOSIS — M7989 Other specified soft tissue disorders: Secondary | ICD-10-CM

## 2014-10-03 NOTE — Progress Notes (Signed)
*  Preliminary Results* Left lower extremity venous duplex completed. Left lower extremity is negative for deep vein thrombosis. There is evidence of left Baker's cyst. There is a complex area of the left medial calf, suggestive of possible ruptured Baker's cyst versus muscle tear versus unknown etiology.  10/03/2014 5:04 PM  Maudry Mayhew, RVT, RDCS, RDMS

## 2014-12-30 IMAGING — CR DG KNEE COMPLETE 4+V*R*
4 series · 4 of 4 positions shown · non-contrast
Comparison: None.

CLINICAL DATA: Pain.

EXAM:
RIGHT KNEE - COMPLETE 4+ VIEW

[t knee ap right *]
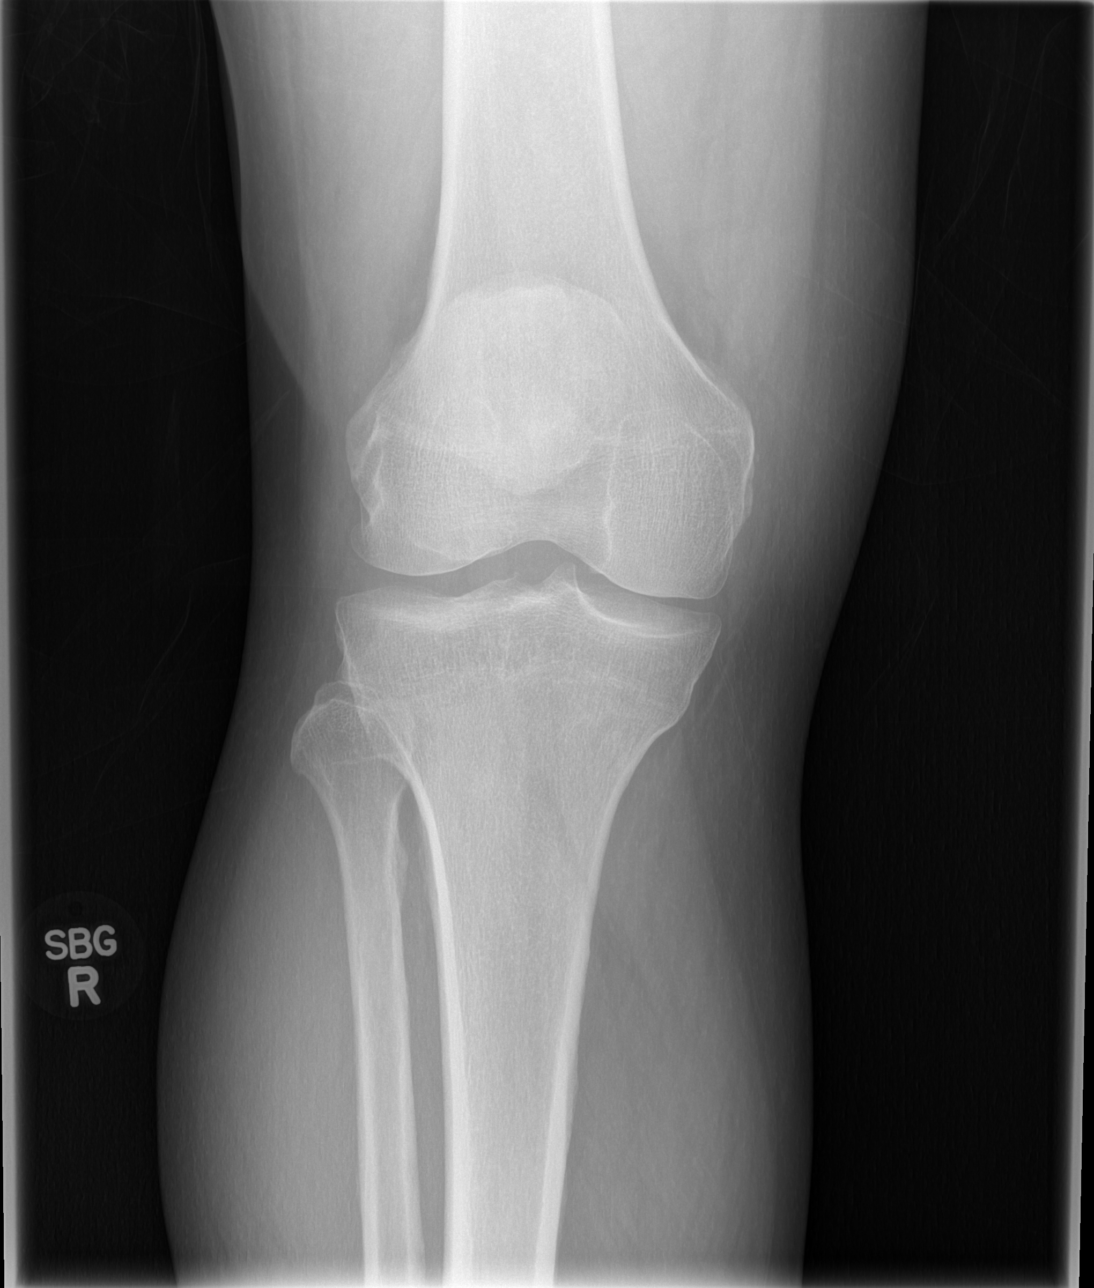

[t knee oblique right * (1 of 2)]
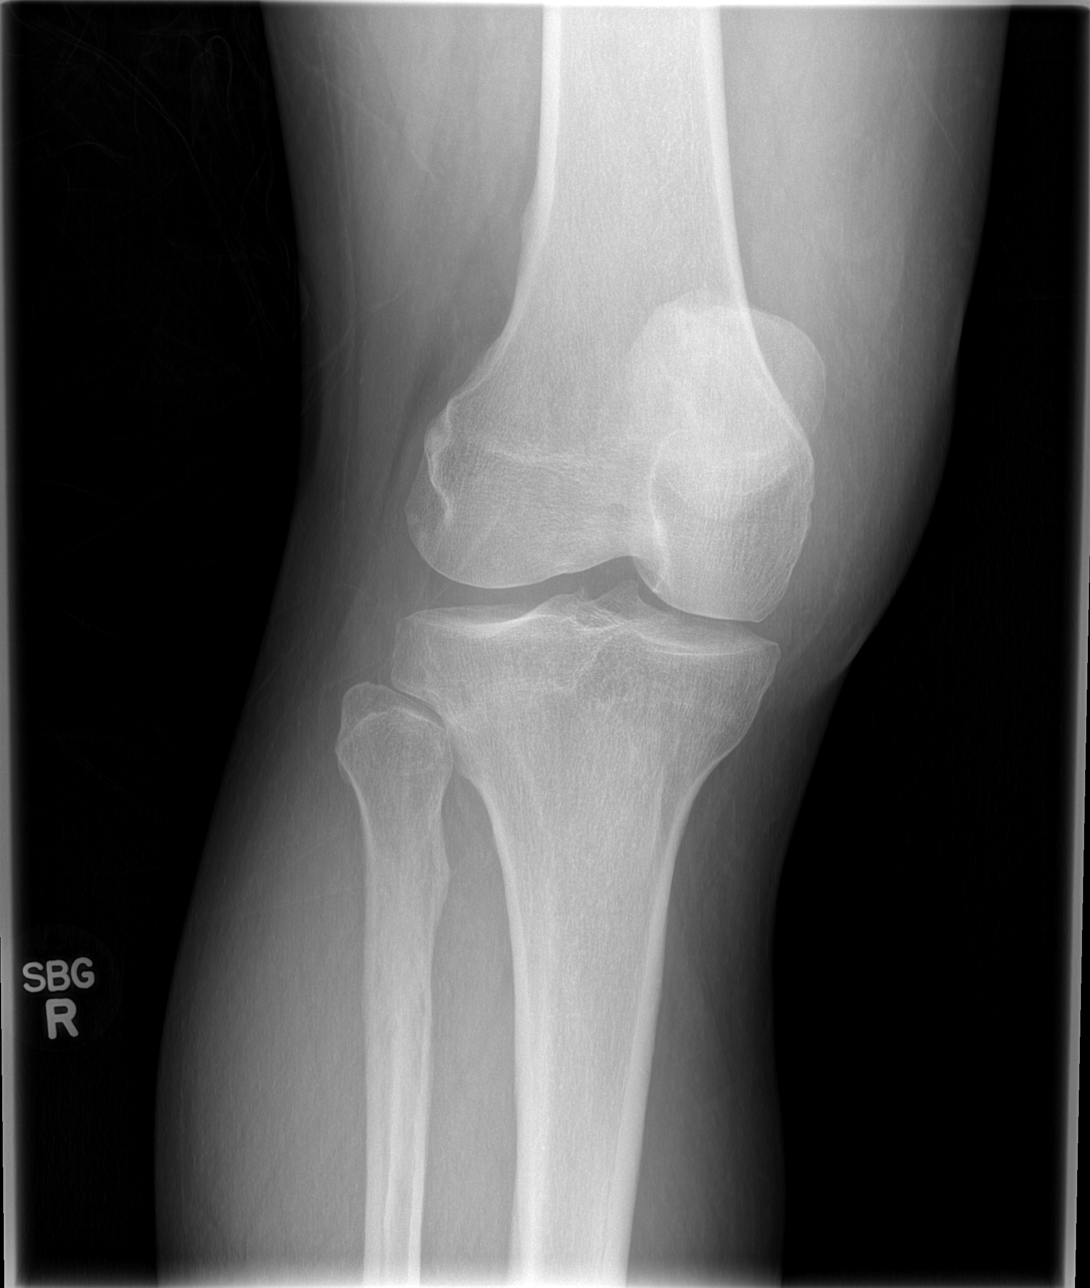

[t knee oblique right * (2 of 2)]
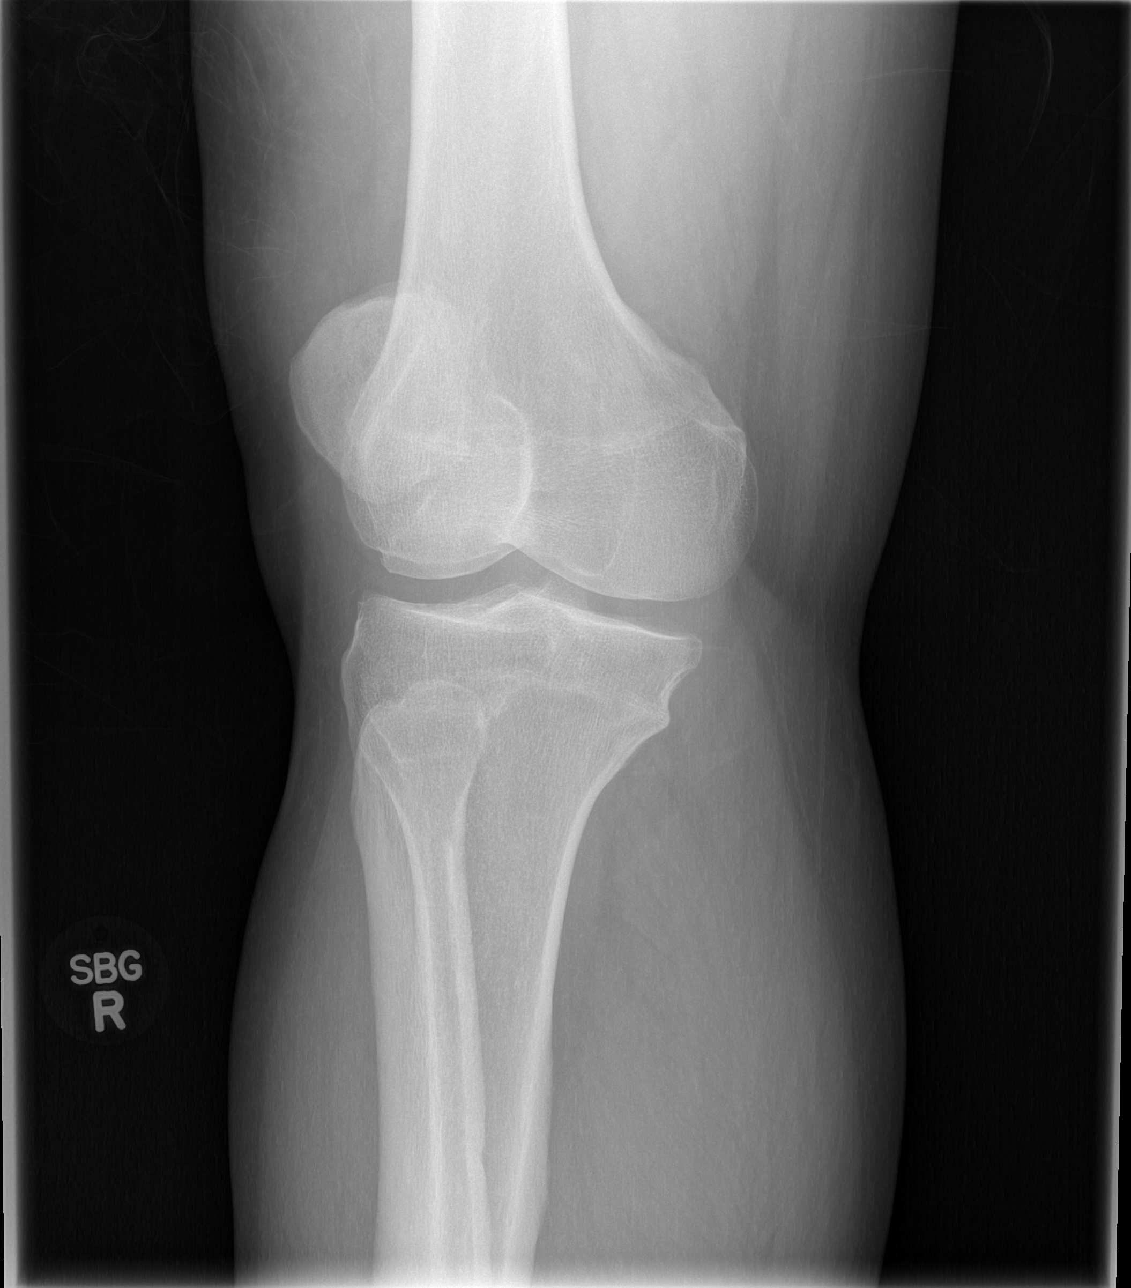

[t knee lat right *]
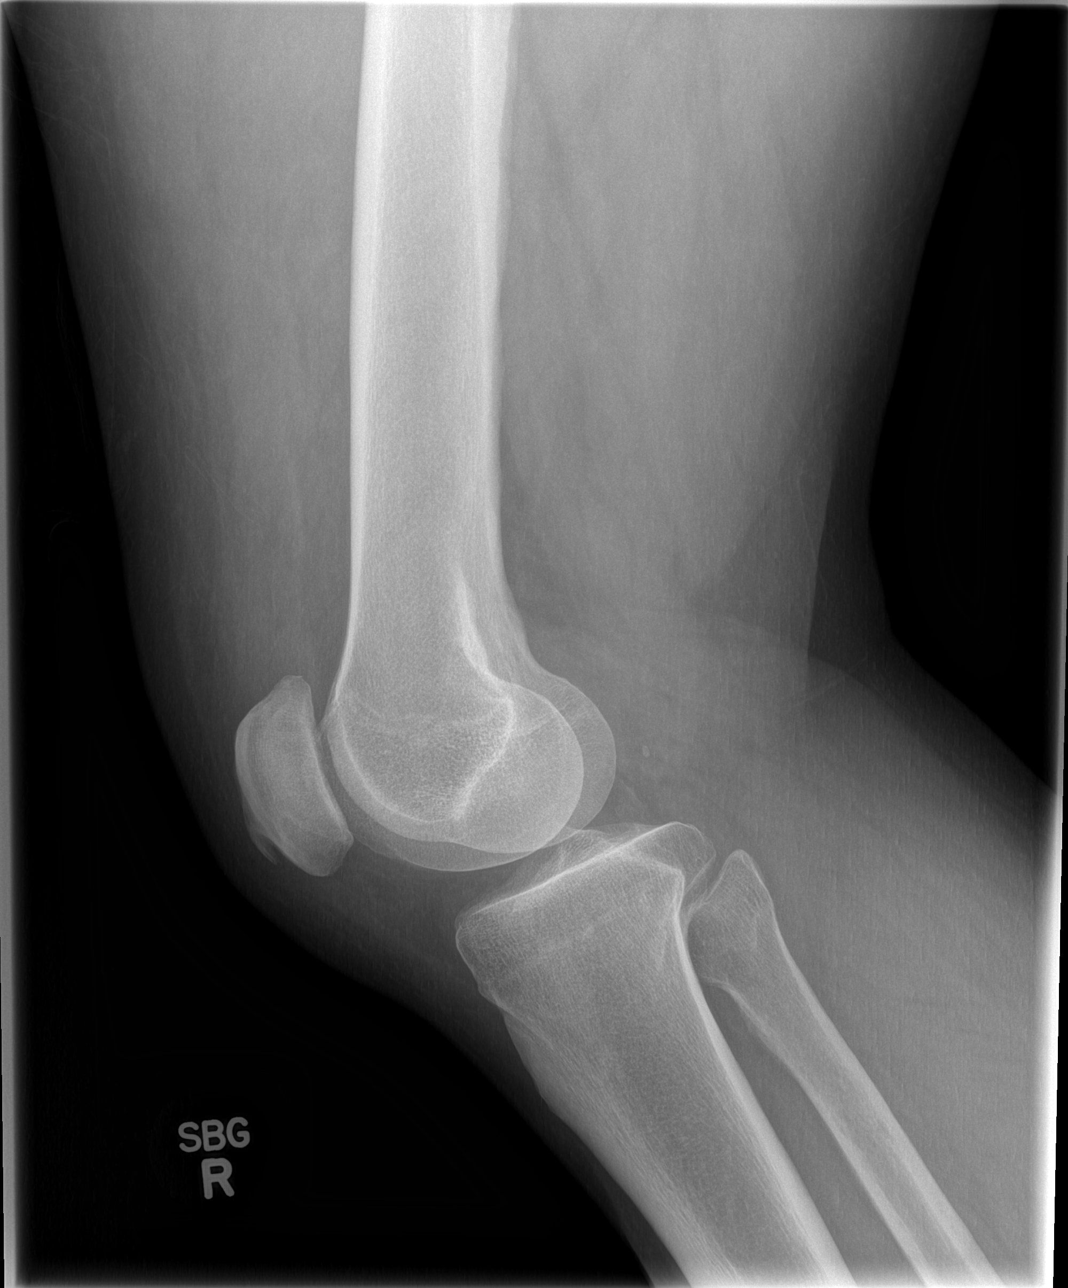

[4 of 4 positions shown; findings below may reference images not displayed]

FINDINGS: No acute bony or joint abnormality. Diffuse degenerative change. No
evidence of fracture or dislocation.
IMPRESSION: DJD.  No acute abnormality.
